# Patient Record
Sex: Female | Born: 1950 | Race: White | Hispanic: No | Marital: Single | Smoking: Never smoker
Health system: Southern US, Community
[De-identification: ages and names within clinical notes are randomized; demographics above are authoritative.]

## PROBLEM LIST (undated history)

## (undated) DIAGNOSIS — I10 Essential (primary) hypertension: Secondary | ICD-10-CM

## (undated) DIAGNOSIS — M7660 Achilles tendinitis, unspecified leg: Secondary | ICD-10-CM

## (undated) HISTORY — DX: Achilles tendinitis, unspecified leg: M76.60

## (undated) HISTORY — DX: Essential (primary) hypertension: I10

---

## 2000-03-29 HISTORY — PX: ANAL FISTULECTOMY: SHX1139

## 2003-09-07 LAB — HM COLONOSCOPY: HM Colonoscopy: NORMAL

## 2004-03-02 ENCOUNTER — Ambulatory Visit: Payer: Self-pay

## 2004-04-06 ENCOUNTER — Ambulatory Visit: Payer: Self-pay | Admitting: Family Medicine

## 2005-04-12 ENCOUNTER — Ambulatory Visit: Payer: Self-pay | Admitting: Unknown Physician Specialty

## 2006-08-11 ENCOUNTER — Ambulatory Visit: Payer: Self-pay | Admitting: Unknown Physician Specialty

## 2007-01-09 ENCOUNTER — Ambulatory Visit: Payer: Self-pay | Admitting: Unknown Physician Specialty

## 2007-09-19 ENCOUNTER — Ambulatory Visit: Payer: Self-pay | Admitting: Unknown Physician Specialty

## 2008-10-30 ENCOUNTER — Ambulatory Visit: Payer: Self-pay | Admitting: Unknown Physician Specialty

## 2009-06-21 IMAGING — US ULTRASOUND RIGHT BREAST
1 series · 13 of 13 positions shown · non-contrast
Comparison: none

REASON FOR EXAM: tenderness and pain Right breast at [DATE] to 11:00 oclock
COMMENTS:

PROCEDURE:     US  - US BREAST RIGHT  - January 09, 2007 [DATE]
RESULT:       No cystic or solid abnormalities are identified.  No focal
abnormalities are identified.
Negative ultrasound does not preclude biopsy if a palpable lesion is
present.

[Series 1: ultrasound right breast · 13 of 13 slices shown]
[im 1/13]
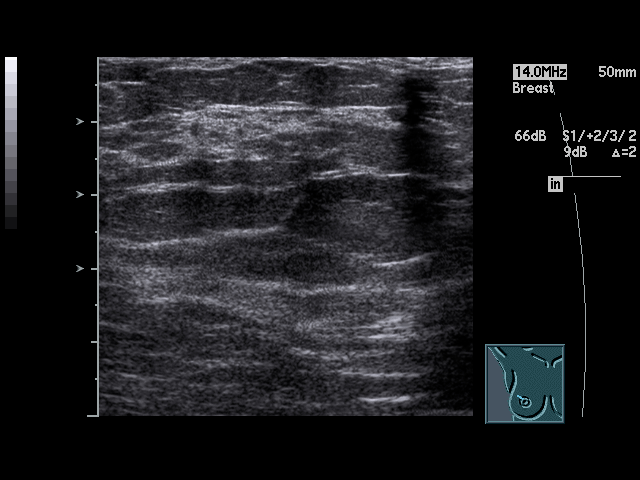
[im 2/13]
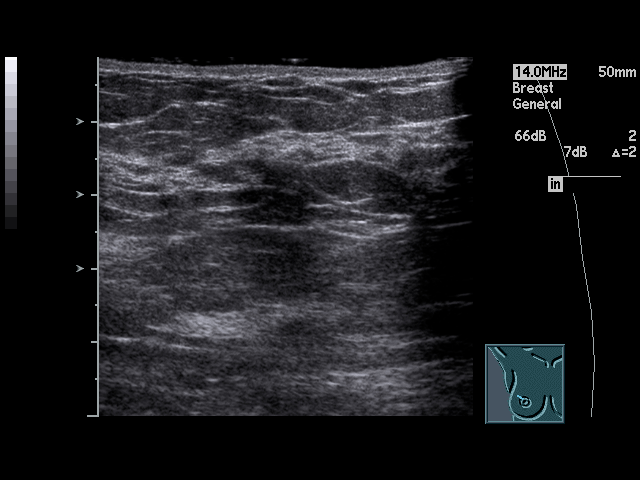
[im 3/13]
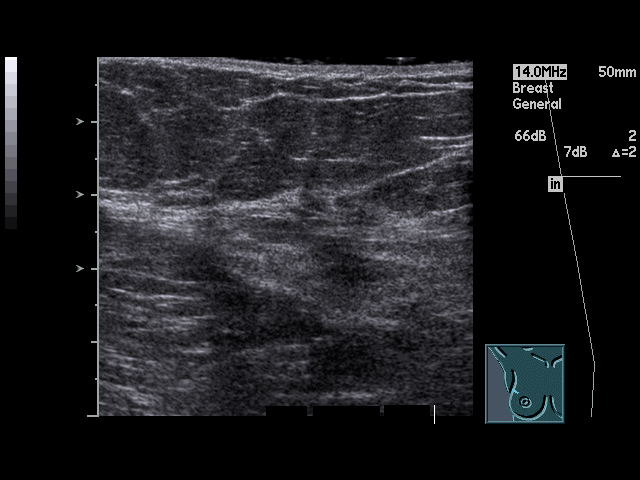
[im 4/13]
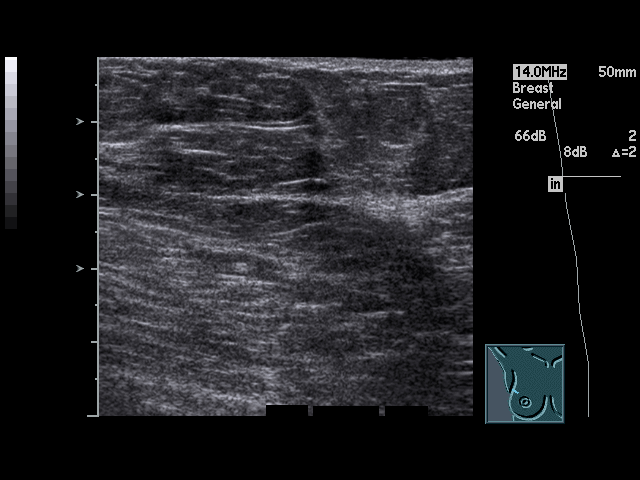
[im 5/13]
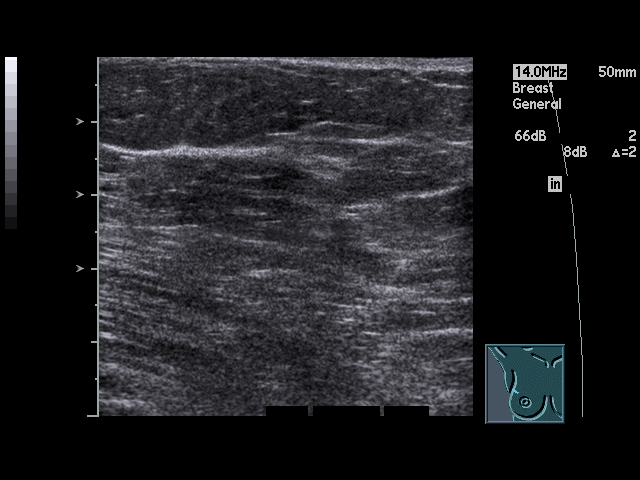
[im 6/13]
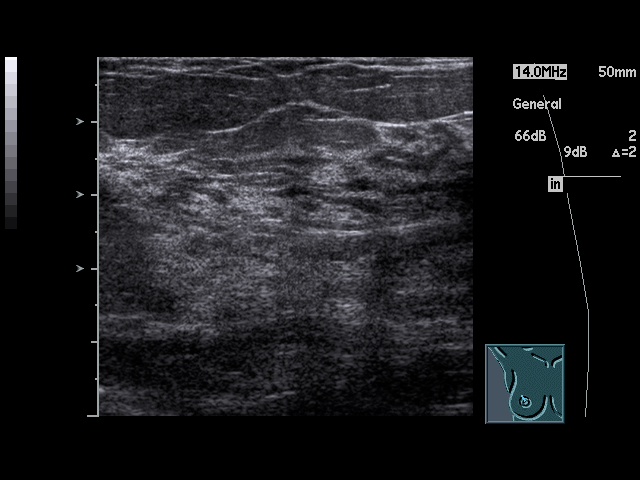
[im 7/13]
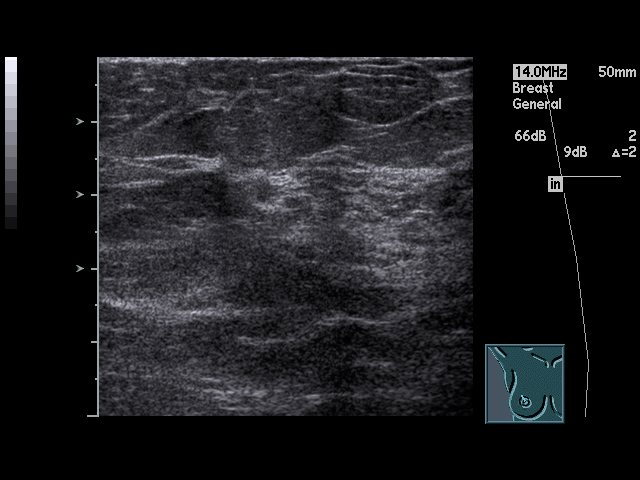
[im 8/13]
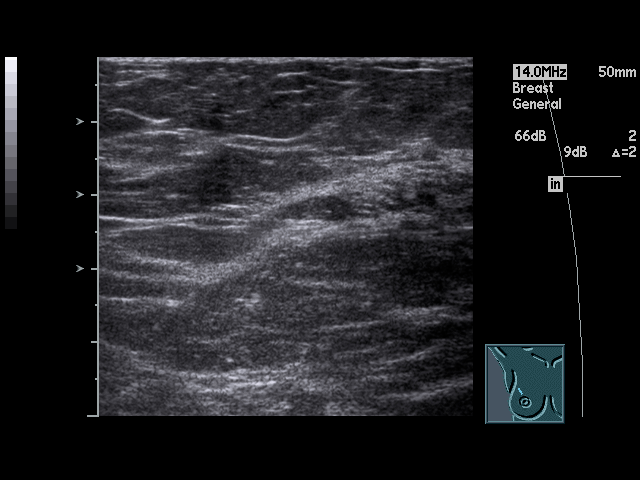
[im 9/13]
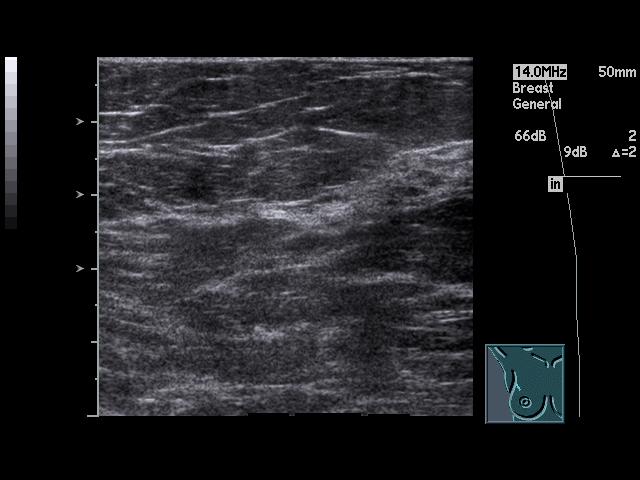
[im 10/13]
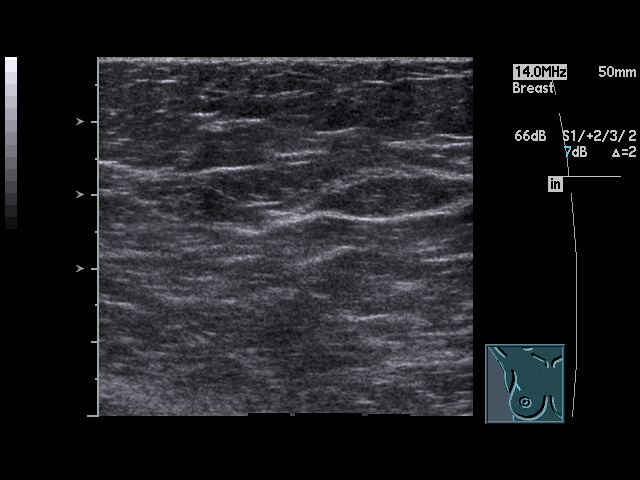
[im 11/13]
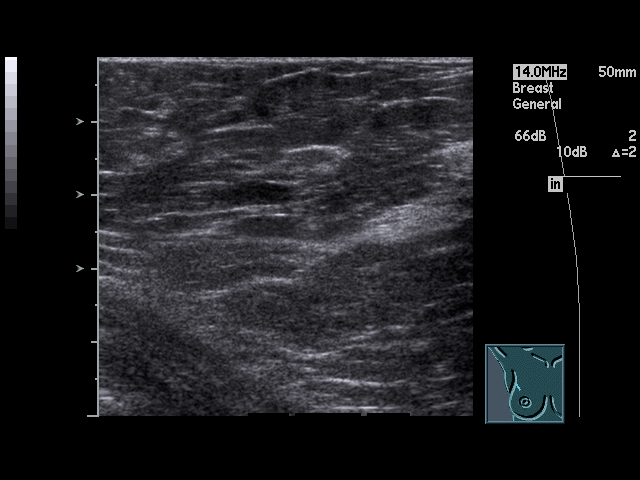
[im 12/13]
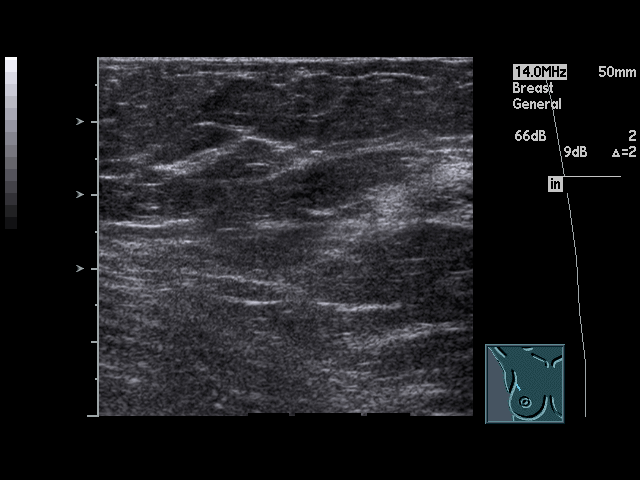
[im 13/13]
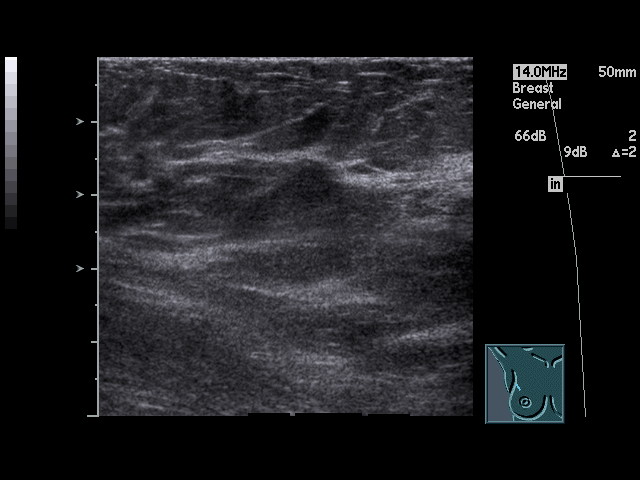

[13 of 13 positions shown; findings below may reference images not displayed]

IMPRESSION: Negative RIGHT breast ultrasound.  See discussion above.

## 2010-06-04 ENCOUNTER — Ambulatory Visit: Payer: Self-pay | Admitting: Unknown Physician Specialty

## 2011-08-05 ENCOUNTER — Ambulatory Visit: Payer: Self-pay | Admitting: Unknown Physician Specialty

## 2011-08-05 LAB — HM MAMMOGRAPHY: HM Mammogram: NORMAL

## 2012-09-06 ENCOUNTER — Encounter: Payer: Self-pay | Admitting: Internal Medicine

## 2012-09-06 ENCOUNTER — Ambulatory Visit (INDEPENDENT_AMBULATORY_CARE_PROVIDER_SITE_OTHER): Payer: No Typology Code available for payment source | Admitting: Internal Medicine

## 2012-09-06 VITALS — BP 104/78 | HR 86 | Temp 98.4°F | Ht 62.75 in | Wt 187.0 lb

## 2012-09-06 DIAGNOSIS — M7662 Achilles tendinitis, left leg: Secondary | ICD-10-CM

## 2012-09-06 DIAGNOSIS — I10 Essential (primary) hypertension: Secondary | ICD-10-CM

## 2012-09-06 DIAGNOSIS — M7660 Achilles tendinitis, unspecified leg: Secondary | ICD-10-CM | POA: Insufficient documentation

## 2012-09-06 DIAGNOSIS — Z1239 Encounter for other screening for malignant neoplasm of breast: Secondary | ICD-10-CM

## 2012-09-06 MED ORDER — LISINOPRIL 20 MG PO TABS: 20.0000 mg | ORAL_TABLET | Freq: Every day | ORAL | Status: DC

## 2012-09-06 NOTE — Assessment & Plan Note (Signed)
Discussed potentially be starting anti-inflammatory medication. Patient prefers to just follow for now. Will request notes from previous physician and podiatrist.

## 2012-09-06 NOTE — Assessment & Plan Note (Signed)
Mammogram ordered

## 2012-09-06 NOTE — Progress Notes (Signed)
Subjective:    Patient ID: Kristie Bell, female    DOB: [birth year], 62 y.o.   MRN: [id number]  HPI 62 year old female with history of hypertension presents to establish care. She reports she is generally feeling well. She has not been seen by a primary care physician in over 2 years. She is compliant with her medication. She has received blood work through her employer, last in October 2013. She denies chest pain, headache, and palpitations.  She also notes a history of chronic Achilles tendinitis in her left foot. She has previously been followed by podiatry. She has used anti-inflammatory medications. She had some improvement with this. She limits her time on her feet and typically wears a shoe with a heel to help with symptoms.  Outpatient Encounter Prescriptions as of 09/06/2012  Medication Sig Dispense Refill  . calcium-vitamin D 250-100 MG-UNIT per tablet Take 1 tablet by mouth 2 (two) times daily.      Marland Kitchen lisinopril (PRINIVIL,ZESTRIL) 20 MG tablet Take 1 tablet (20 mg total) by mouth daily.  90 tablet  4  . Prenatal Vit-Fe Fumarate-FA (M-VIT) tablet Take 1 tablet by mouth daily.      . [DISCONTINUED] lisinopril (PRINIVIL,ZESTRIL) 20 MG tablet Take 20 mg by mouth daily.       No facility-administered encounter medications on file as of 09/06/2012.    Review of Systems  Constitutional: Negative for fever, chills, appetite change, fatigue and unexpected weight change.  HENT: Negative for ear pain, congestion, sore throat, trouble swallowing, neck pain, voice change and sinus pressure.   Eyes: Negative for visual disturbance.  Respiratory: Negative for cough, shortness of breath, wheezing and stridor.   Cardiovascular: Negative for chest pain, palpitations and leg swelling.  Gastrointestinal: Negative for nausea, vomiting, abdominal pain, diarrhea, constipation, blood in stool, abdominal distention and anal bleeding.  Genitourinary: Negative for dysuria and flank pain.  Musculoskeletal:  Negative for myalgias, arthralgias and gait problem.  Skin: Negative for color change and rash.  Neurological: Negative for dizziness and headaches.  Hematological: Negative for adenopathy. Does not bruise/bleed easily.  Psychiatric/Behavioral: Negative for suicidal ideas, sleep disturbance and dysphoric mood. The patient is not nervous/anxious.        Objective:   Physical Exam  Constitutional: She is oriented to person, place, and time. She appears well-developed and well-nourished. No distress.  HENT:  Head: Normocephalic and atraumatic.  Right Ear: External ear normal.  Left Ear: External ear normal.  Nose: Nose normal.  Mouth/Throat: Oropharynx is clear and moist. No oropharyngeal exudate.  Eyes: Conjunctivae are normal. Pupils are equal, round, and reactive to light. Right eye exhibits no discharge. Left eye exhibits no discharge. No scleral icterus.  Neck: Normal range of motion. Neck supple. No tracheal deviation present. No thyromegaly present.  Cardiovascular: Normal rate, regular rhythm, normal heart sounds and intact distal pulses.  Exam reveals no gallop and no friction rub.   No murmur heard. Pulmonary/Chest: Effort normal and breath sounds normal. No accessory muscle usage. Not tachypneic. No respiratory distress. She has no decreased breath sounds. She has no wheezes. She has no rhonchi. She has no rales. She exhibits no tenderness.  Abdominal: Soft. Bowel sounds are normal. She exhibits no distension. There is no tenderness. There is no rebound and no guarding.  Musculoskeletal: Normal range of motion. She exhibits no edema and no tenderness.  Lymphadenopathy:    She has no cervical adenopathy.  Neurological: She is alert and oriented to person, place, and time. No cranial nerve deficit.  She exhibits normal muscle tone. Coordination normal.  Skin: Skin is warm and dry. No rash noted. She is not diaphoretic. No erythema. No pallor.  Psychiatric: She has a normal mood and  affect. Her behavior is normal. Judgment and thought content normal.          Assessment & Plan:

## 2012-09-06 NOTE — Assessment & Plan Note (Signed)
BP Readings from Last 3 Encounters:  09/06/12 104/78   Blood pressure well-controlled on lisinopril. Will continue. Will check renal function with labs.

## 2012-09-14 ENCOUNTER — Encounter: Payer: Self-pay | Admitting: Emergency Medicine

## 2012-09-19 ENCOUNTER — Ambulatory Visit: Payer: Self-pay | Admitting: Internal Medicine

## 2012-09-25 ENCOUNTER — Ambulatory Visit (INDEPENDENT_AMBULATORY_CARE_PROVIDER_SITE_OTHER): Payer: No Typology Code available for payment source | Admitting: Internal Medicine

## 2012-09-25 ENCOUNTER — Other Ambulatory Visit (HOSPITAL_COMMUNITY)
Admission: RE | Admit: 2012-09-25 | Discharge: 2012-09-25 | Disposition: A | Discharge status: Home / Self Care | Payer: No Typology Code available for payment source | Source: Ambulatory Visit | Attending: Internal Medicine | Admitting: Internal Medicine

## 2012-09-25 ENCOUNTER — Encounter: Payer: Self-pay | Admitting: Internal Medicine

## 2012-09-25 VITALS — BP 110/80 | HR 74 | Temp 97.9°F | Ht 63.0 in | Wt 188.0 lb

## 2012-09-25 DIAGNOSIS — Z01419 Encounter for gynecological examination (general) (routine) without abnormal findings: Secondary | ICD-10-CM | POA: Insufficient documentation

## 2012-09-25 DIAGNOSIS — Z0000 Encounter for general adult medical examination without abnormal findings: Secondary | ICD-10-CM | POA: Insufficient documentation

## 2012-09-25 DIAGNOSIS — Z23 Encounter for immunization: Secondary | ICD-10-CM

## 2012-09-25 DIAGNOSIS — I10 Essential (primary) hypertension: Secondary | ICD-10-CM

## 2012-09-25 DIAGNOSIS — Z1151 Encounter for screening for human papillomavirus (HPV): Secondary | ICD-10-CM | POA: Insufficient documentation

## 2012-09-25 DIAGNOSIS — E669 Obesity, unspecified: Secondary | ICD-10-CM

## 2012-09-25 NOTE — Assessment & Plan Note (Signed)
BP Readings from Last 3 Encounters:  09/25/12 110/80  09/06/12 104/78   Blood pressure well-controlled on lisinopril. Will continue. Will request records on recent renal function drawn by her employer.

## 2012-09-25 NOTE — Assessment & Plan Note (Signed)
General medical exam including breast and pelvic exam normal today. Pap is pending. Mammogram results requested. Colonoscopy UTD. Patient recently had blood work performed by her employer. Will request records on this. Prescription for Zostavax vaccine given today.

## 2012-09-25 NOTE — Progress Notes (Signed)
Subjective:    Patient ID: Kristie Bell, female    DOB: April 06, 1950, 62 y.o.   MRN: [id number]  HPI 62 year old female with history of hypertension presents for annual exam. She reports she is generally feeling well. She did not bring record of blood pressure today. She is compliant with medication. She denies any chest pain, headache, palpitations. She is concerned about the right side of her abdomen may be more full the left side. She denies any abdominal or chest pain. She denies any change in bowel habits.  She has been trying to follow a "gluten-free "diet and be more physically active.  Outpatient Encounter Prescriptions as of 09/25/2012  Medication Sig Dispense Refill  . calcium-vitamin D 250-100 MG-UNIT per tablet Take 1 tablet by mouth 2 (two) times daily.      Marland Kitchen lisinopril (PRINIVIL,ZESTRIL) 20 MG tablet Take 1 tablet (20 mg total) by mouth daily.  90 tablet  4  . Multiple Vitamins-Minerals (CENTRUM SILVER PO) Take by mouth.       No facility-administered encounter medications on file as of 09/25/2012.   BP 110/80  Pulse 74  Temp(Src) 97.9 F (36.6 C) (Oral)  Ht 5\' 3"  (1.6 m)  Wt 188 lb (85.276 kg)  BMI 33.31 kg/m2  SpO2 97%  Review of Systems  Constitutional: Negative for fever, chills, appetite change, fatigue and unexpected weight change.  HENT: Negative for ear pain, congestion, sore throat, trouble swallowing, neck pain, voice change and sinus pressure.   Eyes: Negative for visual disturbance.  Respiratory: Negative for cough, shortness of breath, wheezing and stridor.   Cardiovascular: Negative for chest pain, palpitations and leg swelling.  Gastrointestinal: Negative for nausea, vomiting, abdominal pain, diarrhea, constipation, blood in stool, abdominal distention and anal bleeding.  Genitourinary: Negative for dysuria and flank pain.  Musculoskeletal: Negative for myalgias, arthralgias and gait problem.  Skin: Negative for color change and rash.  Neurological:  Negative for dizziness and headaches.  Hematological: Negative for adenopathy. Does not bruise/bleed easily.  Psychiatric/Behavioral: Negative for suicidal ideas, sleep disturbance and dysphoric mood. The patient is not nervous/anxious.        Objective:   Physical Exam  Constitutional: She is oriented to person, place, and time. She appears well-developed and well-nourished. No distress.  HENT:  Head: Normocephalic and atraumatic.  Right Ear: External ear normal.  Left Ear: External ear normal.  Nose: Nose normal.  Mouth/Throat: Oropharynx is clear and moist. No oropharyngeal exudate.  Eyes: Conjunctivae are normal. Pupils are equal, round, and reactive to light. Right eye exhibits no discharge. Left eye exhibits no discharge. No scleral icterus.  Neck: Normal range of motion. Neck supple. No tracheal deviation present. No thyromegaly present.  Cardiovascular: Normal rate, regular rhythm, normal heart sounds and intact distal pulses.  Exam reveals no gallop and no friction rub.   No murmur heard. Pulmonary/Chest: Effort normal and breath sounds normal. No respiratory distress. She has no wheezes. She has no rales. She exhibits no tenderness.  Abdominal: Soft. Bowel sounds are normal. She exhibits no distension and no mass. There is no tenderness. There is no rebound and no guarding.    Genitourinary: Rectum normal, vagina normal and uterus normal. No breast swelling, tenderness, discharge or bleeding. Pelvic exam was performed with patient supine. There is no rash, tenderness or lesion on the right labia. There is no rash, tenderness or lesion on the left labia. Uterus is not enlarged and not tender. Cervix exhibits no motion tenderness, no discharge and no friability. Right  adnexum displays no mass, no tenderness and no fullness. Left adnexum displays no mass, no tenderness and no fullness. No erythema or tenderness around the vagina. No vaginal discharge found.  Musculoskeletal: Normal  range of motion. She exhibits no edema and no tenderness.  Lymphadenopathy:    She has no cervical adenopathy.  Neurological: She is alert and oriented to person, place, and time. No cranial nerve deficit. She exhibits normal muscle tone. Coordination normal.  Skin: Skin is warm and dry. No rash noted. She is not diaphoretic. No erythema. No pallor.  Psychiatric: She has a normal mood and affect. Her behavior is normal. Judgment and thought content normal.          Assessment & Plan:

## 2012-09-25 NOTE — Assessment & Plan Note (Signed)
Wt Readings from Last 3 Encounters:  09/25/12 188 lb (85.276 kg)  09/06/12 187 lb (84.823 kg)   Body mass index is 33.31 kg/(m^2).  Encouraged healthy diet, low in processed carbohydrates and saturated fat. Encouraged goal of per week of exercise.

## 2012-10-03 ENCOUNTER — Encounter: Payer: Self-pay | Admitting: *Deleted

## 2012-10-11 ENCOUNTER — Encounter: Payer: Self-pay | Admitting: Internal Medicine

## 2013-09-25 LAB — HM PAP SMEAR: HM PAP: NEGATIVE

## 2013-11-19 ENCOUNTER — Other Ambulatory Visit: Payer: Self-pay | Admitting: Internal Medicine

## 2013-11-27 ENCOUNTER — Telehealth: Payer: Self-pay | Admitting: Internal Medicine

## 2013-11-27 NOTE — Telephone Encounter (Signed)
Mail lab order to where?

## 2013-11-27 NOTE — Telephone Encounter (Signed)
Sorry   To ms Epstein

## 2013-11-27 NOTE — Telephone Encounter (Signed)
Pt has cpx appointment 10/21 and want like to get labs done prior she can get these done at work  Can you please mail order to \

## 2013-11-30 NOTE — Telephone Encounter (Signed)
Mailed lab order to pt. 

## 2014-01-03 ENCOUNTER — Encounter: Payer: No Typology Code available for payment source | Admitting: Internal Medicine

## 2014-01-09 LAB — BASIC METABOLIC PANEL
BUN: 20 mg/dL (ref 4–21)
Creatinine: 0.8 mg/dL (ref 0.5–1.1)
Glucose: 116 mg/dL
Potassium: 4.6 mmol/L (ref 3.4–5.3)
Sodium: 139 mmol/L (ref 137–147)

## 2014-01-09 LAB — LIPID PANEL
Cholesterol: 208 mg/dL — AB (ref 0–200)
HDL: 37 mg/dL (ref 35–70)
LDL CALC: 111 mg/dL
TRIGLYCERIDES: 302 mg/dL — AB (ref 40–160)

## 2014-01-09 LAB — HEPATIC FUNCTION PANEL
ALK PHOS: 114 U/L (ref 25–125)
ALT: 18 U/L (ref 7–35)
AST: 23 U/L (ref 13–35)
BILIRUBIN, TOTAL: 0.2 mg/dL

## 2014-01-09 LAB — CBC AND DIFFERENTIAL
HCT: 42 % (ref 36–46)
Hemoglobin: 14 g/dL (ref 12.0–16.0)
NEUTROS ABS: 8 /uL
PLATELETS: 247 10*3/uL (ref 150–399)
WBC: 12 10*3/mL

## 2014-01-09 LAB — TSH: TSH: 3.26 u[IU]/mL (ref 0.41–5.90)

## 2014-01-09 LAB — HEMOGLOBIN A1C: Hgb A1c MFr Bld: 5.9 % (ref 4.0–6.0)

## 2014-01-16 ENCOUNTER — Ambulatory Visit: Payer: Self-pay | Admitting: Internal Medicine

## 2014-01-16 ENCOUNTER — Ambulatory Visit (INDEPENDENT_AMBULATORY_CARE_PROVIDER_SITE_OTHER): Payer: No Typology Code available for payment source | Admitting: Internal Medicine

## 2014-01-16 ENCOUNTER — Encounter: Payer: Self-pay | Admitting: *Deleted

## 2014-01-16 ENCOUNTER — Encounter: Payer: Self-pay | Admitting: Internal Medicine

## 2014-01-16 VITALS — BP 112/68 | HR 74 | Temp 97.8°F | Ht 62.9 in | Wt 190.5 lb

## 2014-01-16 DIAGNOSIS — Z0000 Encounter for general adult medical examination without abnormal findings: Secondary | ICD-10-CM

## 2014-01-16 DIAGNOSIS — I10 Essential (primary) hypertension: Secondary | ICD-10-CM

## 2014-01-16 DIAGNOSIS — Z1211 Encounter for screening for malignant neoplasm of colon: Secondary | ICD-10-CM

## 2014-01-16 DIAGNOSIS — E781 Pure hyperglyceridemia: Secondary | ICD-10-CM | POA: Insufficient documentation

## 2014-01-16 DIAGNOSIS — E669 Obesity, unspecified: Secondary | ICD-10-CM | POA: Insufficient documentation

## 2014-01-16 MED ORDER — FLUTICASONE PROPIONATE 50 MCG/ACT NA SUSP: 1.0000 | Freq: Every day | NASAL | Status: DC

## 2014-01-16 MED ORDER — LISINOPRIL 20 MG PO TABS: 20.0000 mg | ORAL_TABLET | Freq: Every day | ORAL | Status: DC

## 2014-01-16 NOTE — Assessment & Plan Note (Signed)
Wt Readings from Last 3 Encounters:  01/16/14 190 lb 8 oz (86.41 kg)  09/25/12 188 lb (85.276 kg)  09/06/12 187 lb (84.823 kg)   Body mass index is 33.84 kg/(m^2). Encouraged healthy diet and exercise with goal of weight loss.

## 2014-01-16 NOTE — Patient Instructions (Signed)

## 2014-01-16 NOTE — Assessment & Plan Note (Signed)
Triglycerides noted to be elevated >300 on recent labs. Encouraged effort at low-sugar diet and regular exercise with goal of weight loss. Will plan repeat labs in 6 months.

## 2014-01-16 NOTE — Assessment & Plan Note (Signed)
General medical exam including breast exam normal today. Mammogram ordered. PAP and pelvic deferred as PAP normal, HPV neg in 2014. Colonoscopy ordered. Immunizations are UTD. Labs reviewed with pt.

## 2014-01-16 NOTE — Progress Notes (Signed)
Subjective:    Patient ID: Kristie Bell, female    DOB: 1950-10-07, 63 y.o.   MRN: [id number]  HPI 63YO female presents for annual exam.  Recently has had some bilateral breast pain. No nodules noted. She is upset as her mammogram could not be scheduled until she was seen.  Had blood work performed 1 week ago. Not aware of results.  No changes to medical history since her last visit 1.5 years ago.  She is not following any specific diet. She is exercising occasionally by using an elliptical.  Review of Systems  Constitutional: Negative for fever, chills, appetite change, fatigue and unexpected weight change.  Eyes: Negative for visual disturbance.  Respiratory: Negative for shortness of breath.   Cardiovascular: Negative for chest pain and leg swelling.  Gastrointestinal: Negative for nausea, vomiting, abdominal pain, diarrhea and constipation.  Musculoskeletal: Negative for arthralgias and myalgias.  Skin: Negative for color change and rash.  Hematological: Negative for adenopathy. Does not bruise/bleed easily.  Psychiatric/Behavioral: Negative for suicidal ideas, sleep disturbance and dysphoric mood. The patient is not nervous/anxious.        Objective:    BP 112/68  Pulse 74  Temp(Src) 97.8 F (36.6 C) (Oral)  Ht 5' 2.9" (1.598 m)  Wt 190 lb 8 oz (86.41 kg)  BMI 33.84 kg/m2  SpO2 96% Physical Exam  Constitutional: She is oriented to person, place, and time. She appears well-developed and well-nourished. No distress.  HENT:  Head: Normocephalic and atraumatic.  Right Ear: External ear normal.  Left Ear: External ear normal.  Nose: Nose normal.  Mouth/Throat: Oropharynx is clear and moist. No oropharyngeal exudate.  Eyes: Conjunctivae are normal. Pupils are equal, round, and reactive to light. Right eye exhibits no discharge. Left eye exhibits no discharge. No scleral icterus.  Neck: Normal range of motion. Neck supple. No tracheal deviation present. No thyromegaly  present.  Cardiovascular: Normal rate, regular rhythm, normal heart sounds and intact distal pulses.  Exam reveals no gallop and no friction rub.   No murmur heard. Pulmonary/Chest: Effort normal and breath sounds normal. No accessory muscle usage. Not tachypneic. No respiratory distress. She has no decreased breath sounds. She has no wheezes. She has no rhonchi. She has no rales. She exhibits no tenderness. Right breast exhibits no inverted nipple, no mass, no nipple discharge, no skin change and no tenderness. Left breast exhibits no inverted nipple, no mass, no nipple discharge, no skin change and no tenderness. Breasts are symmetrical.  Abdominal: Soft. Bowel sounds are normal. She exhibits no distension and no mass. There is no tenderness. There is no rebound and no guarding.  Musculoskeletal: Normal range of motion. She exhibits no edema and no tenderness.  Lymphadenopathy:    She has no cervical adenopathy.  Neurological: She is alert and oriented to person, place, and time. No cranial nerve deficit. She exhibits normal muscle tone. Coordination normal.  Skin: Skin is warm and dry. No rash noted. She is not diaphoretic. No erythema. No pallor.  Psychiatric: She has a normal mood and affect. Her behavior is normal. Judgment and thought content normal.          Assessment & Plan:   Problem List Items Addressed This Visit     Unprioritized   Essential hypertension, benign      BP Readings from Last 3 Encounters:  01/16/14 112/68  09/25/12 110/80  09/06/12 104/78   BP well controlled on lisinopril. Renal function normal on recent labs.    Relevant  Medications      lisinopril (PRINIVIL,ZESTRIL) tablet   Hypertriglyceridemia     Triglycerides noted to be elevated >300 on recent labs. Encouraged effort at low-sugar diet and regular exercise with goal of weight loss. Will plan repeat labs in 6 months.    Relevant Medications      lisinopril (PRINIVIL,ZESTRIL) tablet   Obesity  (BMI 30-39.9)      Wt Readings from Last 3 Encounters:  01/16/14 190 lb 8 oz (86.41 kg)  09/25/12 188 lb (85.276 kg)  09/06/12 187 lb (84.823 kg)   Body mass index is 33.84 kg/(m^2). Encouraged healthy diet and exercise with goal of weight loss.    Routine general medical examination at a health care facility - Primary     General medical exam including breast exam normal today. Mammogram ordered. PAP and pelvic deferred as PAP normal, HPV neg in 2014. Colonoscopy ordered. Immunizations are UTD. Labs reviewed with pt.    Relevant Orders      MM Digital Screening   Screening for colon cancer     Referral placed for colonoscopy.    Relevant Orders      Ambulatory referral to Gastroenterology       Return in about 6 months (around 07/18/2014) for Recheck.

## 2014-01-16 NOTE — Assessment & Plan Note (Signed)
Referral placed for colonoscopy. 

## 2014-01-16 NOTE — Assessment & Plan Note (Signed)
BP Readings from Last 3 Encounters:  01/16/14 112/68  09/25/12 110/80  09/06/12 104/78   BP well controlled on lisinopril. Renal function normal on recent labs.

## 2014-01-16 NOTE — Progress Notes (Signed)
Pre visit review using our clinic review tool, if applicable. No additional management support is needed unless otherwise documented below in the visit note. 

## 2014-01-17 ENCOUNTER — Telehealth: Payer: Self-pay | Admitting: Internal Medicine

## 2014-01-17 NOTE — Telephone Encounter (Signed)
Mammogram from 10/21 showed "possible distortion" in the right breast. They have recommended additional views on mammogram and also ultrasound. Has this been scheduled?

## 2014-01-17 NOTE — Telephone Encounter (Signed)
emmi mailed  °

## 2014-01-21 NOTE — Telephone Encounter (Signed)
Left vm for pt to return my call.  

## 2014-01-22 NOTE — Telephone Encounter (Signed)
Pt states that she received a call from the hospital but didn't get the call back number, gave her the number to contact them to schedule the appt

## 2014-02-05 ENCOUNTER — Ambulatory Visit: Payer: Self-pay | Admitting: Internal Medicine

## 2014-02-05 ENCOUNTER — Telehealth: Payer: Self-pay | Admitting: Internal Medicine

## 2014-02-05 DIAGNOSIS — R928 Other abnormal and inconclusive findings on diagnostic imaging of breast: Secondary | ICD-10-CM

## 2014-02-05 NOTE — Telephone Encounter (Signed)
Recent mammogram from 11/10 showed persistent area of distortion in the right breast. They recommended bilateral breast MRI.Has this been scheduled?

## 2014-02-07 ENCOUNTER — Other Ambulatory Visit: Payer: Self-pay | Admitting: Internal Medicine

## 2014-02-07 NOTE — Telephone Encounter (Signed)
Order placed for MRI

## 2014-02-07 NOTE — Telephone Encounter (Signed)
Pt states [facility] contacted her and told her MRI was recommended, but states her PCP would schedule this for her. Advised our [facility] would contact her with an appointment

## 2014-02-17 ENCOUNTER — Ambulatory Visit
Admission: RE | Admit: 2014-02-17 | Discharge: 2014-02-17 | Disposition: A | Discharge status: Home / Self Care | Payer: No Typology Code available for payment source | Source: Ambulatory Visit | Attending: Internal Medicine | Admitting: Internal Medicine

## 2014-02-17 ENCOUNTER — Other Ambulatory Visit: Payer: No Typology Code available for payment source

## 2014-02-17 DIAGNOSIS — R928 Other abnormal and inconclusive findings on diagnostic imaging of breast: Secondary | ICD-10-CM

## 2014-02-17 MED ORDER — GADOBENATE DIMEGLUMINE 529 MG/ML IV SOLN
17.0000 mL | Freq: Once | INTRAVENOUS | Status: AC | PRN
  Administered 2014-02-17: 17 mL via INTRAVENOUS

## 2014-02-18 ENCOUNTER — Encounter: Payer: Self-pay | Admitting: Internal Medicine

## 2014-02-20 ENCOUNTER — Telehealth: Payer: Self-pay

## 2014-02-20 NOTE — Telephone Encounter (Signed)
Notified pt that we have not received any results as of now

## 2014-02-20 NOTE — Telephone Encounter (Signed)
The patient called asking for a call regarding her MRI results.   Callback [phone]

## 2014-02-26 ENCOUNTER — Encounter: Payer: Self-pay | Admitting: Internal Medicine

## 2014-03-14 ENCOUNTER — Telehealth: Payer: Self-pay | Admitting: Internal Medicine

## 2014-03-14 NOTE — Telephone Encounter (Signed)
Opened in error

## 2014-04-22 ENCOUNTER — Ambulatory Visit: Payer: Self-pay | Admitting: Gastroenterology

## 2014-06-03 ENCOUNTER — Encounter: Payer: Self-pay | Admitting: Internal Medicine

## 2014-07-01 ENCOUNTER — Telehealth: Payer: Self-pay | Admitting: *Deleted

## 2014-07-01 NOTE — Telephone Encounter (Signed)
Pt called left message requesting whether Dr Gilford Rile needs additional labs.  Last OV 10.21.15, did not see orders for labs however there was a note in OV for pt to return for f/u in 6 months.  I left message for pt to return call for clarification.

## 2014-08-07 ENCOUNTER — Telehealth: Payer: Self-pay | Admitting: *Deleted

## 2014-08-08 NOTE — Telephone Encounter (Signed)
Please see below note

## 2014-09-11 LAB — HEPATIC FUNCTION PANEL
ALT: 21 U/L (ref 7–35)
AST: 20 U/L (ref 13–35)
Alkaline Phosphatase: 93 U/L (ref 25–125)
Bilirubin, Total: 0.3 mg/dL

## 2014-09-11 LAB — LIPID PANEL
CHOLESTEROL: 184 mg/dL (ref 0–200)
HDL: 39 mg/dL (ref 35–70)
LDL Cholesterol: 103 mg/dL
Triglycerides: 208 mg/dL — AB (ref 40–160)

## 2014-09-11 LAB — BASIC METABOLIC PANEL
BUN: 15 mg/dL (ref 4–21)
CREATININE: 0.8 mg/dL (ref 0.5–1.1)
Glucose: 125 mg/dL
Potassium: 4.7 mmol/L (ref 3.4–5.3)
Sodium: 142 mmol/L (ref 137–147)

## 2014-09-11 LAB — CBC AND DIFFERENTIAL
HCT: 41 % (ref 36–46)
Hemoglobin: 13.6 g/dL (ref 12.0–16.0)
Neutrophils Absolute: 5 /uL
PLATELETS: 219 10*3/uL (ref 150–399)
WBC: 9.5 10*3/mL

## 2014-09-11 LAB — HEMOGLOBIN A1C: Hgb A1c MFr Bld: 6 % (ref 4.0–6.0)

## 2014-09-11 LAB — TSH: TSH: 2.63 u[IU]/mL (ref 0.41–5.90)

## 2014-09-23 ENCOUNTER — Encounter: Payer: Self-pay | Admitting: Internal Medicine

## 2014-09-23 ENCOUNTER — Encounter: Payer: Self-pay | Admitting: *Deleted

## 2014-09-23 ENCOUNTER — Ambulatory Visit (INDEPENDENT_AMBULATORY_CARE_PROVIDER_SITE_OTHER): Payer: No Typology Code available for payment source | Admitting: Internal Medicine

## 2014-09-23 VITALS — BP 95/61 | HR 74 | Temp 98.1°F | Ht 62.9 in | Wt 191.5 lb

## 2014-09-23 DIAGNOSIS — E781 Pure hyperglyceridemia: Secondary | ICD-10-CM

## 2014-09-23 DIAGNOSIS — Z1239 Encounter for other screening for malignant neoplasm of breast: Secondary | ICD-10-CM

## 2014-09-23 DIAGNOSIS — I10 Essential (primary) hypertension: Secondary | ICD-10-CM | POA: Diagnosis not present

## 2014-09-23 DIAGNOSIS — E8881 Metabolic syndrome: Secondary | ICD-10-CM

## 2014-09-23 NOTE — Progress Notes (Signed)
Pre visit review using our clinic review tool, if applicable. No additional management support is needed unless otherwise documented below in the visit note. 

## 2014-09-23 NOTE — Assessment & Plan Note (Signed)
Reviewed recent MRI breast with pt which showed normal bilateral breast, BIRADS1. Recommended follow up 3d mammogram in 12/2014.

## 2014-09-23 NOTE — Assessment & Plan Note (Signed)
Discussed recent labs which showed elevated BG, low HDL, elevated TG, c/w metabolic syndrome. Encouraged her to set goal for exercise >127min per week and follow a low glycemic diet.

## 2014-09-23 NOTE — Assessment & Plan Note (Signed)
TG elevated on labs with low HDL. Encouraged healthy, Mediterranean style diet and exercise.

## 2014-09-23 NOTE — Assessment & Plan Note (Signed)
BP Readings from Last 3 Encounters:  09/23/14 95/61  01/16/14 112/68  09/25/12 110/80   BP well controlled on current medication. Recent renal function was normal.

## 2014-09-23 NOTE — Progress Notes (Signed)
Subjective:    Patient ID: Kristie Bell, female    DOB: 1950-06-03, 64 y.o.   MRN: [id number]  HPI  64YO female presents for follow up.  HTN - BP has been well controlled at home. Compliant with medications. No CP, palpitations.  Concerned about previous mammogram last year being "abnormal."  Also concerned about elevated BG on recent labs with A1c of 6% and elevated TG 200.  BP Readings from Last 3 Encounters:  09/23/14 95/61  01/16/14 112/68  09/25/12 110/80     Past medical, surgical, family and social history per today's encounter.  Review of Systems  Constitutional: Negative for fever, chills, appetite change, fatigue and unexpected weight change.  Eyes: Negative for visual disturbance.  Respiratory: Negative for shortness of breath.   Cardiovascular: Negative for chest pain and leg swelling.  Gastrointestinal: Negative for abdominal pain.  Skin: Negative for color change and rash.  Hematological: Negative for adenopathy. Does not bruise/bleed easily.  Psychiatric/Behavioral: Negative for dysphoric mood. The patient is not nervous/anxious.        Objective:    BP 95/61 mmHg  Pulse 74  Temp(Src) 98.1 F (36.7 C) (Oral)  Ht 5' 2.9" (1.598 m)  Wt 191 lb 8 oz (86.864 kg)  BMI 34.02 kg/m2  SpO2 97% Physical Exam  Constitutional: She is oriented to person, place, and time. She appears well-developed and well-nourished. No distress.  HENT:  Head: Normocephalic and atraumatic.  Right Ear: External ear normal.  Left Ear: External ear normal.  Nose: Nose normal.  Mouth/Throat: Oropharynx is clear and moist. No oropharyngeal exudate.  Eyes: Conjunctivae are normal. Pupils are equal, round, and reactive to light. Right eye exhibits no discharge. Left eye exhibits no discharge. No scleral icterus.  Neck: Normal range of motion. Neck supple. No tracheal deviation present. No thyromegaly present.  Cardiovascular: Normal rate, regular rhythm, normal heart sounds and  intact distal pulses.  Exam reveals no gallop and no friction rub.   No murmur heard. Pulmonary/Chest: Effort normal and breath sounds normal. No respiratory distress. She has no wheezes. She has no rales. She exhibits no tenderness.  Musculoskeletal: Normal range of motion. She exhibits no edema or tenderness.  Lymphadenopathy:    She has no cervical adenopathy.  Neurological: She is alert and oriented to person, place, and time. No cranial nerve deficit. She exhibits normal muscle tone. Coordination normal.  Skin: Skin is warm and dry. No rash noted. She is not diaphoretic. No erythema. No pallor.  Psychiatric: She has a normal mood and affect. Her behavior is normal. Judgment and thought content normal.          Assessment & Plan:   Problem List Items Addressed This Visit      Unprioritized   Essential hypertension, benign - Primary    BP Readings from Last 3 Encounters:  09/23/14 95/61  01/16/14 112/68  09/25/12 110/80   BP well controlled on current medication. Recent renal function was normal.      Hypertriglyceridemia    TG elevated on labs with low HDL. Encouraged healthy, Mediterranean style diet and exercise.      Metabolic syndrome    Discussed recent labs which showed elevated BG, low HDL, elevated TG, c/w metabolic syndrome. Encouraged her to set goal for exercise >159min per week and follow a low glycemic diet.      Screening for breast cancer    Reviewed recent MRI breast with pt which showed normal bilateral breast, BIRADS1. Recommended follow up  3d mammogram in 12/2014.          Return in about 6 months (around 03/25/2015) for Physical.

## 2014-09-23 NOTE — Patient Instructions (Addendum)
Follow up in 6 months.  Consider reading the book Always Hungry by Dr. Isabella Stalling.  Protein bars - [employer]

## 2014-12-01 ENCOUNTER — Other Ambulatory Visit: Payer: Self-pay | Admitting: Internal Medicine

## 2015-02-05 ENCOUNTER — Telehealth: Payer: Self-pay | Admitting: Internal Medicine

## 2015-02-05 NOTE — Telephone Encounter (Signed)
Pt called about needing the form sent via mail to her to get her lab work done. Need orders. Pt needs it before her lab appt on 02/17/2015.Thank You!

## 2015-02-06 NOTE — Telephone Encounter (Signed)
[facility] form ready for your signature/lab selection in folder

## 2015-02-07 NOTE — Telephone Encounter (Signed)
Mailed [facility] lab order form to pt

## 2015-02-10 ENCOUNTER — Telehealth: Payer: Self-pay | Admitting: Internal Medicine

## 2015-02-10 NOTE — Telephone Encounter (Signed)
Did we fill this out already?

## 2015-02-10 NOTE — Telephone Encounter (Signed)
Needs her blood work mailed to her before her CPE.

## 2015-02-11 ENCOUNTER — Telehealth: Payer: Self-pay | Admitting: *Deleted

## 2015-02-11 NOTE — Telephone Encounter (Signed)
See additional phone note started on 02/05/15

## 2015-02-11 NOTE — Telephone Encounter (Signed)
Patient requested a lab sheet that Dr. Gilford Rile would like drawn. Before her physical. This lab sheet will be needed before Monday.

## 2015-02-11 NOTE — Telephone Encounter (Signed)
Left a message for patient that form was mailed to her on Friday, she should receive it today or tomorrow.

## 2015-02-11 NOTE — Telephone Encounter (Signed)
As per phone note on 02/05/15, [facility] order was mailed to the patient

## 2015-02-17 ENCOUNTER — Other Ambulatory Visit: Payer: Self-pay | Admitting: Physician Assistant

## 2015-02-17 LAB — CBC AND DIFFERENTIAL
HCT: 39 % (ref 36–46)
HEMOGLOBIN: 13.2 g/dL (ref 12.0–16.0)
PLATELETS: 225 10*3/uL (ref 150–399)

## 2015-02-17 LAB — BASIC METABOLIC PANEL
BUN: 12 mg/dL (ref 4–21)
Creatinine: 0.7 mg/dL (ref 0.5–1.1)
GLUCOSE: 101 mg/dL
Potassium: 4.4 mmol/L (ref 3.4–5.3)
Sodium: 141 mmol/L (ref 137–147)

## 2015-02-17 LAB — TSH: TSH: 3.06 u[IU]/mL (ref 0.41–5.90)

## 2015-02-17 LAB — LIPID PANEL
Cholesterol: 190 mg/dL (ref 0–200)
LDL Cholesterol: 114 mg/dL

## 2015-02-17 NOTE — Progress Notes (Signed)
Patient came in to have blood drawn per Dr. Ronette Deter at [facility] in [place].  Blood was drawn from right arm without any incident.

## 2015-02-18 LAB — CMP12+LP+TP+TSH+6AC+CBC/D/PLT
ALT: 26 IU/L (ref 0–32)
AST: 30 IU/L (ref 0–40)
Albumin/Globulin Ratio: 1.6 (ref 1.1–2.5)
Albumin: 4 g/dL (ref 3.6–4.8)
Alkaline Phosphatase: 100 IU/L (ref 39–117)
BILIRUBIN TOTAL: 0.3 mg/dL (ref 0.0–1.2)
BUN / CREAT RATIO: 17 (ref 11–26)
BUN: 12 mg/dL (ref 8–27)
Basophils Absolute: 0 10*3/uL (ref 0.0–0.2)
Basos: 0 %
CREATININE: 0.71 mg/dL (ref 0.57–1.00)
Calcium: 8.9 mg/dL (ref 8.7–10.3)
Chloride: 102 mmol/L (ref 97–106)
Chol/HDL Ratio: 4.9 ratio units — ABNORMAL HIGH (ref 0.0–4.4)
Cholesterol, Total: 190 mg/dL (ref 100–199)
EOS (ABSOLUTE): 0.5 10*3/uL — ABNORMAL HIGH (ref 0.0–0.4)
EOS: 5 %
Estimated CHD Risk: 1.3 times avg. — ABNORMAL HIGH (ref 0.0–1.0)
Free Thyroxine Index: 2.5 (ref 1.2–4.9)
GFR calc Af Amer: 103 mL/min/{1.73_m2} (ref >59)
GFR, EST NON AFRICAN AMERICAN: 90 mL/min/{1.73_m2} (ref >59)
GGT: 21 IU/L (ref 0–60)
GLUCOSE: 101 mg/dL — AB (ref 65–99)
Globulin, Total: 2.5 g/dL (ref 1.5–4.5)
HDL: 39 mg/dL — ABNORMAL LOW (ref >39)
HEMOGLOBIN: 13.2 g/dL (ref 11.1–15.9)
Hematocrit: 38.9 % (ref 34.0–46.6)
Immature Grans (Abs): 0 10*3/uL (ref 0.0–0.1)
Immature Granulocytes: 0 %
Iron: 98 ug/dL (ref 27–139)
LDH: 189 IU/L (ref 119–226)
LDL Calculated: 114 mg/dL — ABNORMAL HIGH (ref 0–99)
Lymphocytes Absolute: 2.5 10*3/uL (ref 0.7–3.1)
Lymphs: 25 %
MCH: 29.5 pg (ref 26.6–33.0)
MCHC: 33.9 g/dL (ref 31.5–35.7)
MCV: 87 fL (ref 79–97)
MONOCYTES: 6 %
Monocytes Absolute: 0.6 10*3/uL (ref 0.1–0.9)
NEUTROS ABS: 6.3 10*3/uL (ref 1.4–7.0)
Neutrophils: 64 %
POTASSIUM: 4.4 mmol/L (ref 3.5–5.2)
Phosphorus: 3.4 mg/dL (ref 2.5–4.5)
Platelets: 225 10*3/uL (ref 150–379)
RBC: 4.48 x10E6/uL (ref 3.77–5.28)
RDW: 13 % (ref 12.3–15.4)
Sodium: 141 mmol/L (ref 136–144)
T3 Uptake Ratio: 27 % (ref 24–39)
T4 TOTAL: 9.2 ug/dL (ref 4.5–12.0)
TSH: 3.06 u[IU]/mL (ref 0.450–4.500)
Total Protein: 6.5 g/dL (ref 6.0–8.5)
Triglycerides: 184 mg/dL — ABNORMAL HIGH (ref 0–149)
URIC ACID: 7.2 mg/dL — AB (ref 2.5–7.1)
VLDL Cholesterol Cal: 37 mg/dL (ref 5–40)
WBC: 10 10*3/uL (ref 3.4–10.8)

## 2015-02-18 LAB — VITAMIN D 25 HYDROXY (VIT D DEFICIENCY, FRACTURES): Vit D, 25-Hydroxy: 36.8 ng/mL (ref 30.0–100.0)

## 2015-02-18 LAB — HGB A1C W/O EAG: Hgb A1c MFr Bld: 6.2 % — ABNORMAL HIGH (ref 4.8–5.6)

## 2015-02-18 NOTE — Progress Notes (Signed)
Lab results were faxed to Dr. Ronette Deter at [facility] and a copy was mailed to the patient per her request.

## 2015-03-04 ENCOUNTER — Ambulatory Visit (INDEPENDENT_AMBULATORY_CARE_PROVIDER_SITE_OTHER): Payer: No Typology Code available for payment source | Admitting: Internal Medicine

## 2015-03-04 ENCOUNTER — Other Ambulatory Visit: Payer: Self-pay | Admitting: *Deleted

## 2015-03-04 ENCOUNTER — Encounter: Payer: Self-pay | Admitting: Internal Medicine

## 2015-03-04 ENCOUNTER — Other Ambulatory Visit (HOSPITAL_COMMUNITY)
Admission: RE | Admit: 2015-03-04 | Discharge: 2015-03-04 | Disposition: A | Discharge status: Home / Self Care | Payer: No Typology Code available for payment source | Source: Ambulatory Visit | Attending: Internal Medicine | Admitting: Internal Medicine

## 2015-03-04 VITALS — BP 115/75 | HR 81 | Temp 98.1°F | Ht 63.0 in | Wt 180.5 lb

## 2015-03-04 DIAGNOSIS — Z1151 Encounter for screening for human papillomavirus (HPV): Secondary | ICD-10-CM | POA: Insufficient documentation

## 2015-03-04 DIAGNOSIS — Z1239 Encounter for other screening for malignant neoplasm of breast: Secondary | ICD-10-CM

## 2015-03-04 DIAGNOSIS — Z0000 Encounter for general adult medical examination without abnormal findings: Secondary | ICD-10-CM

## 2015-03-04 DIAGNOSIS — E8881 Metabolic syndrome: Secondary | ICD-10-CM

## 2015-03-04 DIAGNOSIS — Z0001 Encounter for general adult medical examination with abnormal findings: Secondary | ICD-10-CM

## 2015-03-04 DIAGNOSIS — Z23 Encounter for immunization: Secondary | ICD-10-CM | POA: Diagnosis not present

## 2015-03-04 DIAGNOSIS — R1032 Left lower quadrant pain: Secondary | ICD-10-CM | POA: Diagnosis not present

## 2015-03-04 DIAGNOSIS — Z299 Encounter for prophylactic measures, unspecified: Secondary | ICD-10-CM

## 2015-03-04 DIAGNOSIS — Z01419 Encounter for gynecological examination (general) (routine) without abnormal findings: Secondary | ICD-10-CM | POA: Insufficient documentation

## 2015-03-04 NOTE — Progress Notes (Signed)
Subjective:    Patient ID: Kristie Bell, female    DOB: [birth year], 64 y.o.   MRN: [id number]  HPI  64YO female presents for physical exam.  Left lower abdominal pain - 6-7 months of intermittent left lower abdominal pain. Described as throbbing pain. Not severe. No diarrhea. No constipation. No urinary symptoms. No previous abdominal surgery. No bloating.  Aside from this, feeling well. Concerned about BG, however not following any specific diet or exercise program.  Wt Readings from Last 3 Encounters:  03/04/15 180 lb 8 oz (81.874 kg)  09/23/14 191 lb 8 oz (86.864 kg)  02/17/14 190 lb (86.183 kg)   BP Readings from Last 3 Encounters:  03/04/15 115/75  09/23/14 95/61  01/16/14 112/68    Past Medical History  Diagnosis Date  . Hypertension   . Achilles tendinitis     left, Dr. Elvina Mattes   Family History  Problem Relation Age of Onset  . Pneumonia Father   . Diabetes Paternal Grandmother    Past Surgical History  Procedure Laterality Date  . Anal fistulectomy  2002   Social History   Social History  . Marital Status: Single    Spouse Name: N/A  . Number of Children: N/A  . Years of Education: N/A   Social History Main Topics  . Smoking status: Never Smoker   . Smokeless tobacco: Never Used  . Alcohol Use: Yes     Comment: Occasionally  . Drug Use: No  . Sexual Activity: Not Asked   Other Topics Concern  . None   Social History Narrative   Lives in [place] alone. No children. No pets.      Diet - regular diet, started protein drinks      Exercise - 43min 3-4 times per week      Work - [employer]    Review of Systems  Constitutional: Negative for fever, chills, appetite change, fatigue and unexpected weight change.  Eyes: Negative for visual disturbance.  Respiratory: Negative for shortness of breath.   Cardiovascular: Negative for chest pain and leg swelling.  Gastrointestinal: Positive for abdominal pain. Negative for  nausea, vomiting, diarrhea, constipation, blood in stool and abdominal distention.  Genitourinary: Negative for dysuria, urgency, frequency, flank pain, vaginal bleeding, vaginal discharge, difficulty urinating, vaginal pain and pelvic pain.  Musculoskeletal: Negative for myalgias and arthralgias.  Skin: Negative for color change and rash.  Hematological: Negative for adenopathy. Does not bruise/bleed easily.  Psychiatric/Behavioral: Negative for sleep disturbance and dysphoric mood. The patient is not nervous/anxious.        Objective:    BP 115/75 mmHg  Pulse 81  Temp(Src) 98.1 F (36.7 C) (Oral)  Ht 5\' 3"  (1.6 m)  Wt 180 lb 8 oz (81.874 kg)  BMI 31.98 kg/m2  SpO2 98% Physical Exam  Constitutional: She is oriented to person, place, and time. She appears well-developed and well-nourished. No distress.  HENT:  Head: Normocephalic and atraumatic.  Right Ear: External ear normal.  Left Ear: External ear normal.  Nose: Nose normal.  Mouth/Throat: Oropharynx is clear and moist. No oropharyngeal exudate.  Eyes: Conjunctivae are normal. Pupils are equal, round, and reactive to light. Right eye exhibits no discharge. Left eye exhibits no discharge. No scleral icterus.  Neck: Normal range of motion. Neck supple. No tracheal deviation present. No thyromegaly present.  Cardiovascular: Normal rate, regular rhythm, normal heart sounds and intact distal pulses.  Exam reveals no gallop and no friction rub.   No  murmur heard. Pulmonary/Chest: Effort normal and breath sounds normal. No respiratory distress. She has no wheezes. She has no rales. She exhibits no tenderness.  Abdominal: Soft. Bowel sounds are normal. She exhibits no distension and no mass. There is no tenderness. There is no rebound and no guarding.  Genitourinary: Rectum normal and uterus normal. No breast swelling, tenderness, discharge or bleeding. Pelvic exam was performed with patient supine. There is no rash, tenderness or lesion  on the right labia. There is no rash, tenderness or lesion on the left labia. Uterus is not enlarged and not tender. Cervix exhibits no motion tenderness, no discharge and no friability. Right adnexum displays no mass, no tenderness and no fullness. Left adnexum displays no mass, no tenderness and no fullness. There is erythema (mild) in the vagina. No tenderness in the vagina. No vaginal discharge found.  Musculoskeletal: Normal range of motion. She exhibits no edema or tenderness.  Lymphadenopathy:    She has no cervical adenopathy.  Neurological: She is alert and oriented to person, place, and time. No cranial nerve deficit. She exhibits normal muscle tone. Coordination normal.  Skin: Skin is warm and dry. No rash noted. She is not diaphoretic. No erythema. No pallor.  Psychiatric: She has a normal mood and affect. Her behavior is normal. Judgment and thought content normal.          Assessment & Plan:   Problem List Items Addressed This Visit      Unprioritized   Abdominal pain, left lower quadrant    Left lower abdominal pain. Exam normal except for mild tenderness. No palpable abnormalities. Discussed potential causes of pain. Will get CT abdomen for evaluation.      Relevant Orders   CT Abdomen Pelvis W Contrast   Metabolic syndrome    Discussed elevated BG, obesity and elevated TG with low HDL. Encouraged Mediterranean style diet and exercise with goal of 71min daily.      Routine general medical examination at a health care facility - Primary    General medical exam including breast exam and pelvic exam normal except as noted. PAP pending. Reviewed labs.  Encouraged healthy diet and exercise. Prevnar today.      Screening for breast cancer   Relevant Orders   MM Digital Screening       Return in about 4 weeks (around 04/01/2015) for Recheck.

## 2015-03-04 NOTE — Assessment & Plan Note (Signed)
Discussed elevated BG, obesity and elevated TG with low HDL. Encouraged Mediterranean style diet and exercise with goal of 84min daily.

## 2015-03-04 NOTE — Assessment & Plan Note (Signed)
Left lower abdominal pain. Exam normal except for mild tenderness. No palpable abnormalities. Discussed potential causes of pain. Will get CT abdomen for evaluation.

## 2015-03-04 NOTE — Addendum Note (Signed)
Addended by: Karlene Einstein D on: 03/04/2015 04:23 PM   Modules accepted: Orders

## 2015-03-04 NOTE — Addendum Note (Signed)
Addended by: Carmin Muskrat on: 03/04/2015 09:14 AM   Modules accepted: Orders, SmartSet

## 2015-03-04 NOTE — Patient Instructions (Signed)
We will schedule a CT of your abdomen to better determine the cause of your abdominal pain.  Health Maintenance, Female Adopting a healthy lifestyle and getting preventive care can go a long way to promote health and wellness. Talk with your health care provider about what schedule of regular examinations is right for you. This is a good chance for you to check in with your provider about disease prevention and staying healthy. In between checkups, there are plenty of things you can do on your own. Experts have done a lot of research about which lifestyle changes and preventive measures are most likely to keep you healthy. Ask your health care provider for more information. WEIGHT AND DIET  Eat a healthy diet  Be sure to include plenty of vegetables, fruits, low-fat dairy products, and lean protein.  Do not eat a lot of foods high in solid fats, added sugars, or salt.  Get regular exercise. This is one of the most important things you can do for your health.  Most adults should exercise for at least 150 minutes each week. The exercise should increase your heart rate and make you sweat (moderate-intensity exercise).  Most adults should also do strengthening exercises at least twice a week. This is in addition to the moderate-intensity exercise.  Maintain a healthy weight  Body mass index (BMI) is a measurement that can be used to identify possible weight problems. It estimates body fat based on height and weight. Your health care provider can help determine your BMI and help you achieve or maintain a healthy weight.  For females 106 years of age and older:   A BMI below 18.5 is considered underweight.  A BMI of 18.5 to 24.9 is normal.  A BMI of 25 to 29.9 is considered overweight.  A BMI of 30 and above is considered obese.  Watch levels of cholesterol and blood lipids  You should start having your blood tested for lipids and cholesterol at 65 years of age, then have this test every  5 years.  You may need to have your cholesterol levels checked more often if:  Your lipid or cholesterol levels are high.  You are older than 65 years of age.  You are at high risk for heart disease.  CANCER SCREENING   Lung Cancer  Lung cancer screening is recommended for adults 56-21 years old who are at high risk for lung cancer because of a history of smoking.  A yearly low-dose CT scan of the lungs is recommended for people who:  Currently smoke.  Have quit within the past 15 years.  Have at least a 30-pack-year history of smoking. A pack year is smoking an average of one pack of cigarettes a day for 1 year.  Yearly screening should continue until it has been 15 years since you quit.  Yearly screening should stop if you develop a health problem that would prevent you from having lung cancer treatment.  Breast Cancer  Practice breast self-awareness. This means understanding how your breasts normally appear and feel.  It also means doing regular breast self-exams. Let your health care provider know about any changes, no matter how small.  If you are in your 20s or 30s, you should have a clinical breast exam (CBE) by a health care provider every 1-3 years as part of a regular health exam.  If you are 51 or older, have a CBE every year. Also consider having a breast X-ray (mammogram) every year.  If you have  a family history of breast cancer, talk to your health care provider about genetic screening.  If you are at high risk for breast cancer, talk to your health care provider about having an MRI and a mammogram every year.  Breast cancer gene (BRCA) assessment is recommended for women who have family members with BRCA-related cancers. BRCA-related cancers include:  Breast.  Ovarian.  Tubal.  Peritoneal cancers.  Results of the assessment will determine the need for genetic counseling and BRCA1 and BRCA2 testing. Cervical Cancer Your health care provider may  recommend that you be screened regularly for cancer of the pelvic organs (ovaries, uterus, and vagina). This screening involves a pelvic examination, including checking for microscopic changes to the surface of your cervix (Pap test). You may be encouraged to have this screening done every 3 years, beginning at age 21.  For women ages 30-65, health care providers may recommend pelvic exams and Pap testing every 3 years, or they may recommend the Pap and pelvic exam, combined with testing for human papilloma virus (HPV), every 5 years. Some types of HPV increase your risk of cervical cancer. Testing for HPV may also be done on women of any age with unclear Pap test results.  Other health care providers may not recommend any screening for nonpregnant women who are considered low risk for pelvic cancer and who do not have symptoms. Ask your health care provider if a screening pelvic exam is right for you.  If you have had past treatment for cervical cancer or a condition that could lead to cancer, you need Pap tests and screening for cancer for at least 20 years after your treatment. If Pap tests have been discontinued, your risk factors (such as having a new sexual partner) need to be reassessed to determine if screening should resume. Some women have medical problems that increase the chance of getting cervical cancer. In these cases, your health care provider may recommend more frequent screening and Pap tests. Colorectal Cancer  This type of cancer can be detected and often prevented.  Routine colorectal cancer screening usually begins at 65 years of age and continues through 65 years of age.  Your health care provider may recommend screening at an earlier age if you have risk factors for colon cancer.  Your health care provider may also recommend using home test kits to check for hidden blood in the stool.  A small camera at the end of a tube can be used to examine your colon directly  (sigmoidoscopy or colonoscopy). This is done to check for the earliest forms of colorectal cancer.  Routine screening usually begins at age 50.  Direct examination of the colon should be repeated every 5-10 years through 65 years of age. However, you may need to be screened more often if early forms of precancerous polyps or small growths are found. Skin Cancer  Check your skin from head to toe regularly.  Tell your health care provider about any new moles or changes in moles, especially if there is a change in a mole's shape or color.  Also tell your health care provider if you have a mole that is larger than the size of a pencil eraser.  Always use sunscreen. Apply sunscreen liberally and repeatedly throughout the day.  Protect yourself by wearing long sleeves, pants, a wide-brimmed hat, and sunglasses whenever you are outside. HEART DISEASE, DIABETES, AND HIGH BLOOD PRESSURE   High blood pressure causes heart disease and increases the risk of stroke. High   blood pressure is more likely to develop in:  People who have blood pressure in the high end of the normal range (130-139/85-89 mm Hg).  People who are overweight or obese.  People who are African American.  If you are 18-39 years of age, have your blood pressure checked every 3-5 years. If you are 40 years of age or older, have your blood pressure checked every year. You should have your blood pressure measured twice--once when you are at a hospital or clinic, and once when you are not at a hospital or clinic. Record the average of the two measurements. To check your blood pressure when you are not at a hospital or clinic, you can use:  An automated blood pressure machine at a pharmacy.  A home blood pressure monitor.  If you are between 55 years and 79 years old, ask your health care provider if you should take aspirin to prevent strokes.  Have regular diabetes screenings. This involves taking a blood sample to check your  fasting blood sugar level.  If you are at a normal weight and have a low risk for diabetes, have this test once every three years after 65 years of age.  If you are overweight and have a high risk for diabetes, consider being tested at a younger age or more often. PREVENTING INFECTION  Hepatitis B  If you have a higher risk for hepatitis B, you should be screened for this virus. You are considered at high risk for hepatitis B if:  You were born in a country where hepatitis B is common. Ask your health care provider which countries are considered high risk.  Your parents were born in a high-risk country, and you have not been immunized against hepatitis B (hepatitis B vaccine).  You have HIV or AIDS.  You use needles to inject street drugs.  You live with someone who has hepatitis B.  You have had sex with someone who has hepatitis B.  You get hemodialysis treatment.  You take certain medicines for conditions, including cancer, organ transplantation, and autoimmune conditions. Hepatitis C  Blood testing is recommended for:  Everyone born from 1945 through 1965.  Anyone with known risk factors for hepatitis C. Sexually transmitted infections (STIs)  You should be screened for sexually transmitted infections (STIs) including gonorrhea and chlamydia if:  You are sexually active and are younger than 65 years of age.  You are older than 65 years of age and your health care provider tells you that you are at risk for this type of infection.  Your sexual activity has changed since you were last screened and you are at an increased risk for chlamydia or gonorrhea. Ask your health care provider if you are at risk.  If you do not have HIV, but are at risk, it may be recommended that you take a prescription medicine daily to prevent HIV infection. This is called pre-exposure prophylaxis (PrEP). You are considered at risk if:  You are sexually active and do not regularly use condoms or  know the HIV status of your partner(s).  You take drugs by injection.  You are sexually active with a partner who has HIV. Talk with your health care provider about whether you are at high risk of being infected with HIV. If you choose to begin PrEP, you should first be tested for HIV. You should then be tested every 3 months for as long as you are taking PrEP.  PREGNANCY   If you are   If you are premenopausal and you may become pregnant, ask your health care provider about preconception counseling.  If you may become pregnant, take 400 to 800 micrograms (mcg) of folic acid every day.  If you want to prevent pregnancy, talk to your health care provider about birth control (contraception). OSTEOPOROSIS AND MENOPAUSE   Osteoporosis is a disease in which the bones lose minerals and strength with aging. This can result in serious bone fractures. Your risk for osteoporosis can be identified using a bone density scan.  If you are 65 years of age or older, or if you are at risk for osteoporosis and fractures, ask your health care provider if you should be screened.  Ask your health care provider whether you should take a calcium or vitamin D supplement to lower your risk for osteoporosis.  Menopause may have certain physical symptoms and risks.  Hormone replacement therapy may reduce some of these symptoms and risks. Talk to your health care provider about whether hormone replacement therapy is right for you.  HOME CARE INSTRUCTIONS   Schedule regular health, dental, and eye exams.  Stay current with your immunizations.   Do not use any tobacco products including cigarettes, chewing tobacco, or electronic cigarettes.  If you are pregnant, do not drink alcohol.  If you are breastfeeding, limit how much and how often you drink alcohol.  Limit alcohol intake to no more than 1 drink per day for nonpregnant women. One drink equals 12 ounces of beer, 5 ounces of wine, or 1 ounces of hard  liquor.  Do not use street drugs.  Do not share needles.  Ask your health care provider for help if you need support or information about quitting drugs.  Tell your health care provider if you often feel depressed.  Tell your health care provider if you have ever been abused or do not feel safe at home.   This information is not intended to replace advice given to you by your health care provider. Make sure you discuss any questions you have with your health care provider.   Document Released: 09/28/2010 Document Revised: 04/05/2014 Document Reviewed: 02/14/2013 Elsevier Interactive Patient Education 2016 Elsevier Inc.  

## 2015-03-04 NOTE — Progress Notes (Signed)
Pre visit review using our clinic review tool, if applicable. No additional management support is needed unless otherwise documented below in the visit note. 

## 2015-03-04 NOTE — Telephone Encounter (Signed)
error 

## 2015-03-04 NOTE — Assessment & Plan Note (Signed)
General medical exam including breast exam and pelvic exam normal except as noted. PAP pending. Reviewed labs.  Encouraged healthy diet and exercise. Prevnar today.

## 2015-03-07 LAB — CYTOLOGY - PAP

## 2015-03-12 ENCOUNTER — Telehealth: Payer: Self-pay | Admitting: *Deleted

## 2015-03-12 NOTE — Telephone Encounter (Signed)
Spoke with the patient, reviewed the labs.

## 2015-03-12 NOTE — Telephone Encounter (Signed)
Patient requested a call back in reference to her test resultson 12/06. Please advise

## 2015-03-21 ENCOUNTER — Telehealth: Payer: Self-pay | Admitting: Internal Medicine

## 2015-03-21 NOTE — Telephone Encounter (Signed)
[employer] prior auth needs review by md. Dr. Gilford Rile given information to call for review

## 2015-03-21 NOTE — Telephone Encounter (Signed)
Pt called stating that her procedure was not approved and was told on 03/04/15 when she was at the office that it was. She is having the procedure on 03/25/15 @ 8 am. Please advise patient needs callback to [phone] or call place where procedure is being done.

## 2015-03-21 NOTE — Telephone Encounter (Signed)
Pt called and the hospital has not received the approval for up coming CT scan on her side on Dec. 27th. Pt is wanting to know if she has to call  approval or is it this office. PT spoke to [place] on Dec 14th about this and thought it was approved. Please call her at [phone].

## 2015-03-25 ENCOUNTER — Ambulatory Visit
Admission: RE | Admit: 2015-03-25 | Discharge: 2015-03-25 | Disposition: A | Discharge status: Home / Self Care | Payer: PRIVATE HEALTH INSURANCE | Source: Ambulatory Visit | Attending: Internal Medicine | Admitting: Internal Medicine

## 2015-03-25 ENCOUNTER — Other Ambulatory Visit: Payer: Self-pay | Admitting: Internal Medicine

## 2015-03-25 ENCOUNTER — Ambulatory Visit: Payer: PRIVATE HEALTH INSURANCE

## 2015-03-25 DIAGNOSIS — Z1231 Encounter for screening mammogram for malignant neoplasm of breast: Secondary | ICD-10-CM | POA: Diagnosis present

## 2015-03-25 DIAGNOSIS — Z1239 Encounter for other screening for malignant neoplasm of breast: Secondary | ICD-10-CM

## 2015-03-28 ENCOUNTER — Ambulatory Visit: Admission: RE | Admit: 2015-03-28 | Payer: PRIVATE HEALTH INSURANCE | Source: Ambulatory Visit

## 2015-04-01 ENCOUNTER — Other Ambulatory Visit: Payer: Self-pay | Admitting: Internal Medicine

## 2015-04-01 DIAGNOSIS — R1032 Left lower quadrant pain: Secondary | ICD-10-CM

## 2015-04-08 ENCOUNTER — Ambulatory Visit: Payer: PRIVATE HEALTH INSURANCE

## 2015-04-14 ENCOUNTER — Ambulatory Visit
Admission: RE | Admit: 2015-04-14 | Discharge: 2015-04-14 | Disposition: A | Discharge status: Home / Self Care | Payer: Managed Care, Other (non HMO) | Source: Ambulatory Visit | Attending: Internal Medicine | Admitting: Internal Medicine

## 2015-04-14 ENCOUNTER — Telehealth: Payer: Self-pay

## 2015-04-14 DIAGNOSIS — D259 Leiomyoma of uterus, unspecified: Secondary | ICD-10-CM | POA: Diagnosis not present

## 2015-04-14 DIAGNOSIS — R1032 Left lower quadrant pain: Secondary | ICD-10-CM | POA: Insufficient documentation

## 2015-04-14 NOTE — Telephone Encounter (Signed)
Pt calling back to speak with Almyra Free, she stated that there was message left on her machine to call the office back./tvw

## 2015-04-14 NOTE — Telephone Encounter (Signed)
Gave pt US results.

## 2015-04-28 ENCOUNTER — Telehealth: Payer: Self-pay | Admitting: Internal Medicine

## 2015-04-28 NOTE — Telephone Encounter (Signed)
I see the size of the Fibroids but it does not give an exact location. Please advise

## 2015-04-28 NOTE — Telephone Encounter (Signed)
Pt called wanting to know the size of her fibroids and location of where they are? Please leave on [facility] call @ [phone]. Thank you!

## 2015-04-29 NOTE — Telephone Encounter (Signed)
LMOM

## 2015-06-05 ENCOUNTER — Encounter: Payer: Self-pay | Admitting: Internal Medicine

## 2015-07-01 ENCOUNTER — Other Ambulatory Visit: Payer: Self-pay | Admitting: Emergency Medicine

## 2015-07-01 NOTE — Telephone Encounter (Signed)
Received a faxed medication request from [facility] in [place].  Please advise.  Thank you.

## 2015-07-02 MED ORDER — FLUTICASONE PROPIONATE 50 MCG/ACT NA SUSP: 1.0000 | Freq: Every day | NASAL | Status: AC

## 2015-07-03 ENCOUNTER — Other Ambulatory Visit: Payer: Self-pay

## 2015-07-03 MED ORDER — LISINOPRIL 20 MG PO TABS: ORAL_TABLET | ORAL | Status: DC

## 2016-05-03 ENCOUNTER — Other Ambulatory Visit: Payer: Self-pay | Admitting: Physician Assistant

## 2016-05-03 DIAGNOSIS — Z1231 Encounter for screening mammogram for malignant neoplasm of breast: Secondary | ICD-10-CM

## 2016-05-12 ENCOUNTER — Ambulatory Visit
Admission: RE | Admit: 2016-05-12 | Discharge: 2016-05-12 | Disposition: A | Discharge status: Home / Self Care | Payer: Medicare Other | Source: Ambulatory Visit | Attending: Physician Assistant | Admitting: Physician Assistant

## 2016-05-12 DIAGNOSIS — Z1231 Encounter for screening mammogram for malignant neoplasm of breast: Secondary | ICD-10-CM

## 2016-06-09 ENCOUNTER — Other Ambulatory Visit: Payer: Self-pay | Admitting: Physician Assistant

## 2016-06-28 IMAGING — MG MM DIGITAL SCREENING BILAT W/ TOMO W/ CAD
11 of 12 series · 11 of 12 positions shown · non-contrast
Comparison: Previous Exam(s)

CLINICAL DATA: Screening.

EXAM:
DIGITAL SCREENING BILATERAL MAMMOGRAM WITH 3D TOMO WITH CAD

[R MLO (1 of 4)]
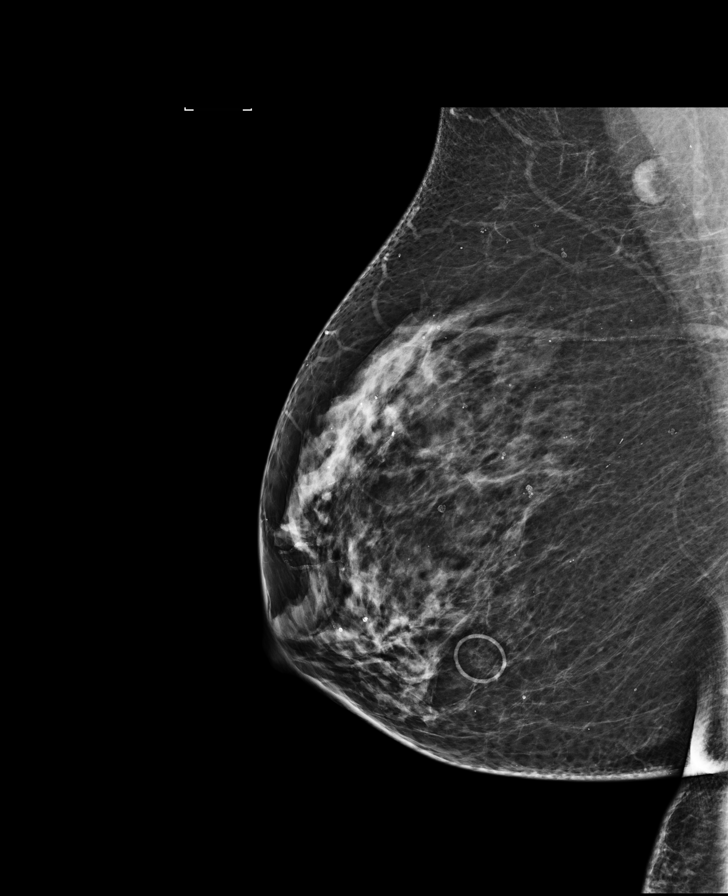

[L MLO]
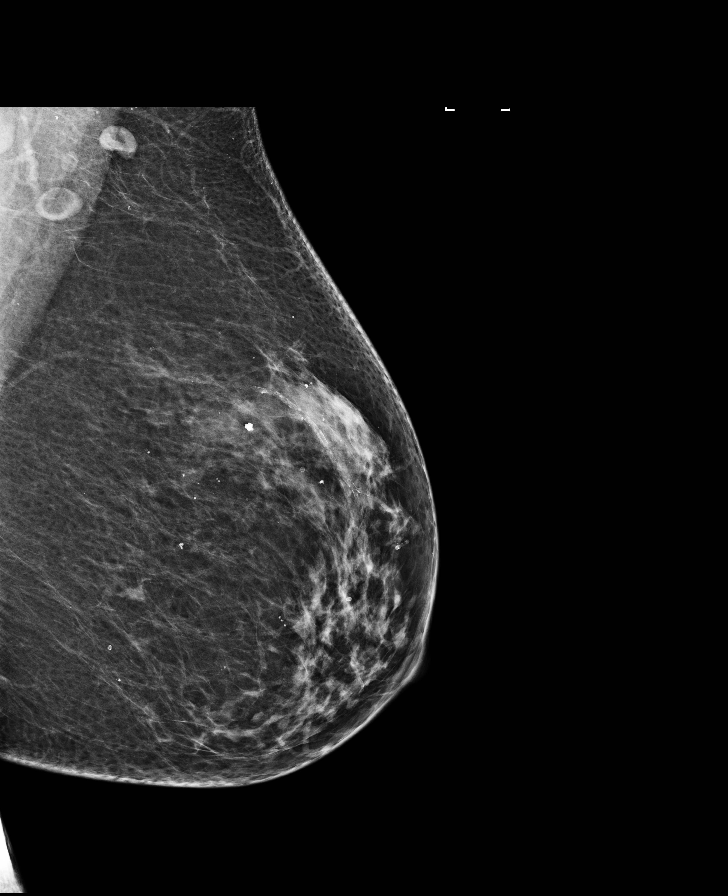

[R CC]
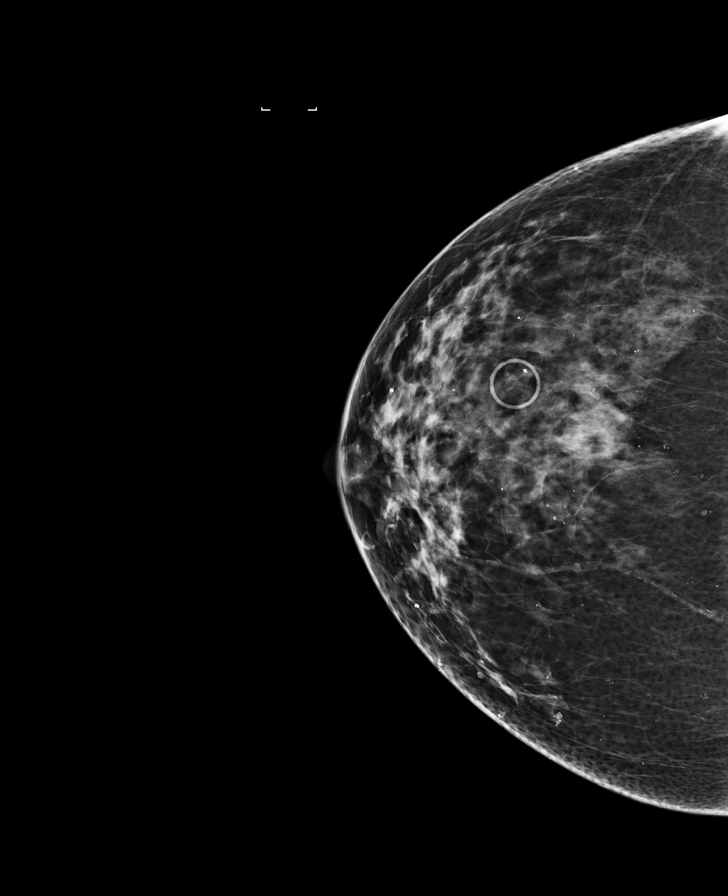

[R MLO (2 of 4)]
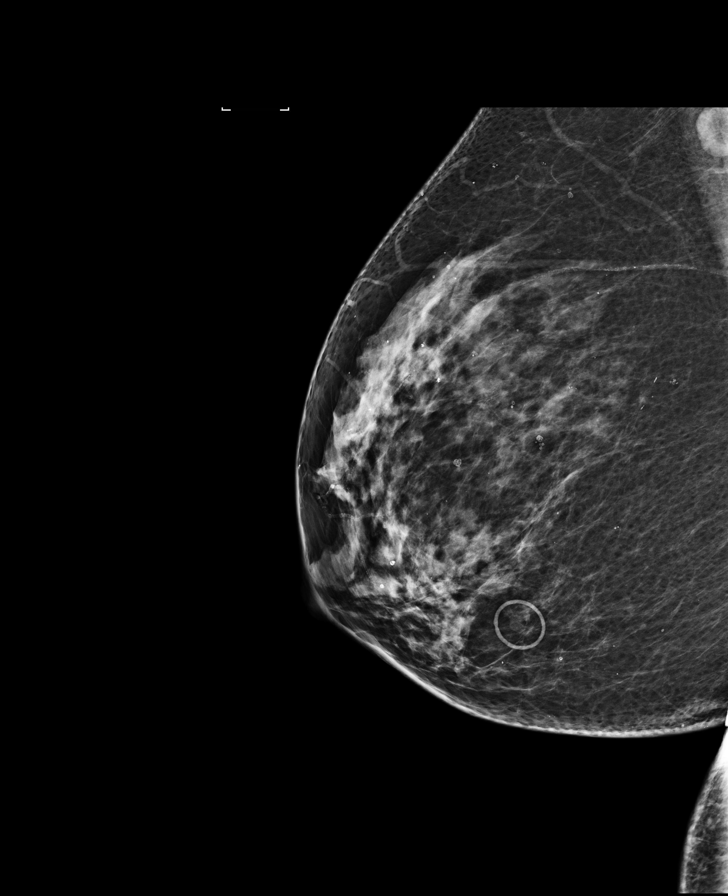

[L CC]
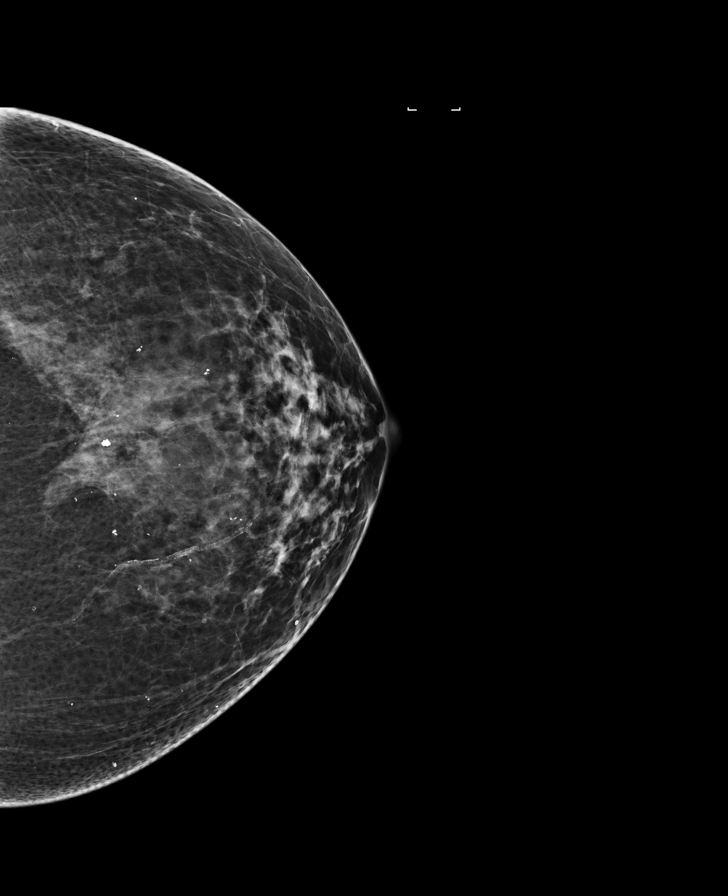

[R MLO tomo]
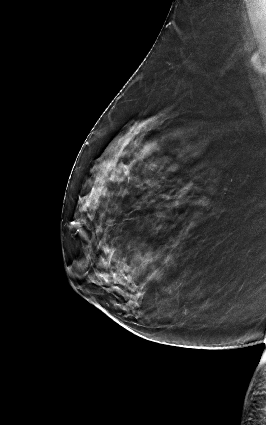

[R CC tomo]
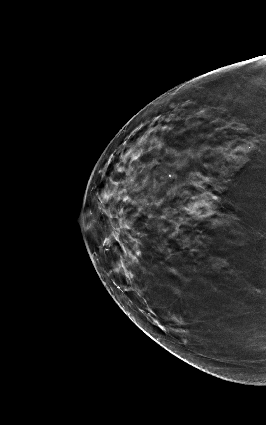

[L MLO tomo]
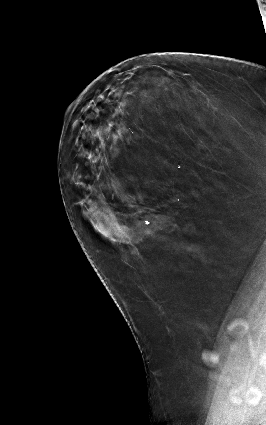

[L CC tomo]
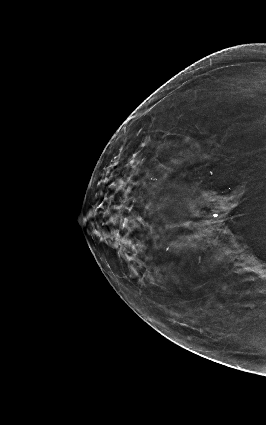

[R MLO (3 of 4)]
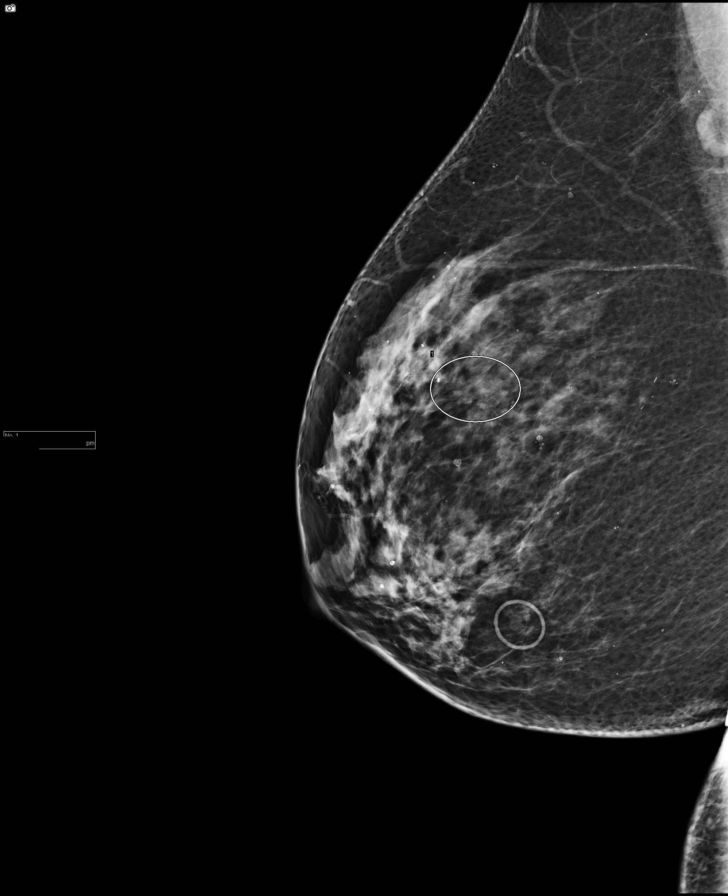

[R MLO (4 of 4)]
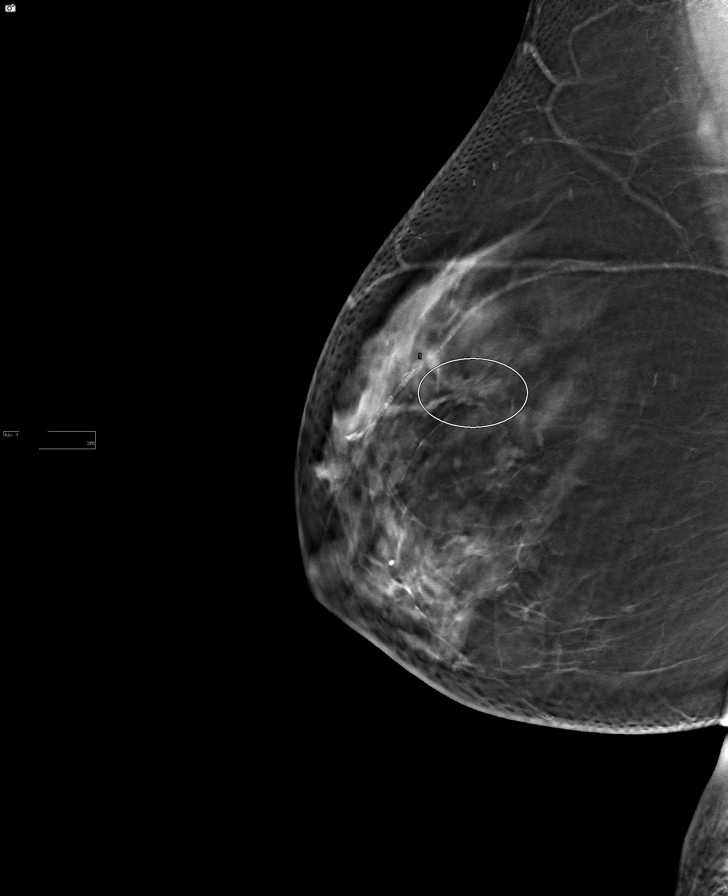

[11 of 12 positions shown; findings below may reference images not displayed]

ACR Breast Density Category c: The breast tissue is heterogeneously
dense, which may obscure small masses.
FINDINGS: In the right breast, possible distortion warrants further evaluation
with spot compression views and possibly ultrasound. In the left
breast, no findings suspicious for malignancy. Images were processed
with CAD.
IMPRESSION: Further evaluation is suggested for possible distortion in the right
breast.

RECOMMENDATION:
Diagnostic mammogram and possibly ultrasound of the right breast.
(Code:[id number])

The patient will be contacted regarding the findings, and additional
imaging will be scheduled.

BI-RADS CATEGORY  0: Incomplete. Need additional imaging evaluation
and/or prior mammograms for comparison.

## 2017-06-13 ENCOUNTER — Other Ambulatory Visit: Payer: Self-pay | Admitting: Physician Assistant

## 2017-06-13 DIAGNOSIS — Z1231 Encounter for screening mammogram for malignant neoplasm of breast: Secondary | ICD-10-CM

## 2017-07-13 ENCOUNTER — Ambulatory Visit
Admission: RE | Admit: 2017-07-13 | Discharge: 2017-07-13 | Disposition: A | Discharge status: Home / Self Care | Payer: Medicare Other | Source: Ambulatory Visit | Attending: Physician Assistant | Admitting: Physician Assistant

## 2017-07-13 DIAGNOSIS — Z1231 Encounter for screening mammogram for malignant neoplasm of breast: Secondary | ICD-10-CM | POA: Insufficient documentation

## 2018-09-07 ENCOUNTER — Other Ambulatory Visit: Payer: Self-pay | Admitting: Physician Assistant

## 2018-09-07 DIAGNOSIS — Z1231 Encounter for screening mammogram for malignant neoplasm of breast: Secondary | ICD-10-CM

## 2018-11-03 ENCOUNTER — Other Ambulatory Visit: Payer: Self-pay

## 2018-11-03 ENCOUNTER — Ambulatory Visit
Admission: RE | Admit: 2018-11-03 | Discharge: 2018-11-03 | Disposition: A | Discharge status: Home / Self Care | Payer: Medicare Other | Source: Ambulatory Visit | Attending: Physician Assistant | Admitting: Physician Assistant

## 2018-11-03 DIAGNOSIS — Z1231 Encounter for screening mammogram for malignant neoplasm of breast: Secondary | ICD-10-CM | POA: Diagnosis not present

## 2019-11-26 ENCOUNTER — Other Ambulatory Visit: Payer: Self-pay | Admitting: Physician Assistant

## 2019-11-26 DIAGNOSIS — Z1231 Encounter for screening mammogram for malignant neoplasm of breast: Secondary | ICD-10-CM

## 2019-12-21 ENCOUNTER — Other Ambulatory Visit: Payer: Self-pay

## 2019-12-21 ENCOUNTER — Ambulatory Visit
Admission: RE | Admit: 2019-12-21 | Discharge: 2019-12-21 | Disposition: A | Discharge status: Home / Self Care | Payer: Medicare PPO | Source: Ambulatory Visit | Attending: Physician Assistant | Admitting: Physician Assistant

## 2019-12-21 DIAGNOSIS — Z1231 Encounter for screening mammogram for malignant neoplasm of breast: Secondary | ICD-10-CM

## 2021-01-14 ENCOUNTER — Other Ambulatory Visit: Payer: Self-pay | Admitting: Physician Assistant

## 2021-01-14 DIAGNOSIS — Z1231 Encounter for screening mammogram for malignant neoplasm of breast: Secondary | ICD-10-CM

## 2021-02-05 ENCOUNTER — Ambulatory Visit
Admission: RE | Admit: 2021-02-05 | Discharge: 2021-02-05 | Disposition: A | Discharge status: Home / Self Care | Payer: Medicare PPO | Source: Ambulatory Visit | Attending: Physician Assistant | Admitting: Physician Assistant

## 2021-02-05 ENCOUNTER — Other Ambulatory Visit: Payer: Self-pay

## 2021-02-05 DIAGNOSIS — Z1231 Encounter for screening mammogram for malignant neoplasm of breast: Secondary | ICD-10-CM | POA: Diagnosis present

## 2022-03-16 ENCOUNTER — Other Ambulatory Visit: Payer: Self-pay | Admitting: Physician Assistant

## 2022-03-16 DIAGNOSIS — Z1231 Encounter for screening mammogram for malignant neoplasm of breast: Secondary | ICD-10-CM

## 2022-04-29 ENCOUNTER — Ambulatory Visit
Admission: RE | Admit: 2022-04-29 | Discharge: 2022-04-29 | Disposition: A | Discharge status: Home / Self Care | Payer: Medicare PPO | Source: Ambulatory Visit | Attending: Physician Assistant | Admitting: Physician Assistant

## 2022-04-29 DIAGNOSIS — Z1231 Encounter for screening mammogram for malignant neoplasm of breast: Secondary | ICD-10-CM | POA: Insufficient documentation

## 2023-03-18 ENCOUNTER — Other Ambulatory Visit: Payer: Self-pay | Admitting: Physician Assistant

## 2023-03-18 DIAGNOSIS — Z1231 Encounter for screening mammogram for malignant neoplasm of breast: Secondary | ICD-10-CM

## 2023-05-02 ENCOUNTER — Ambulatory Visit
Admission: RE | Admit: 2023-05-02 | Discharge: 2023-05-02 | Disposition: A | Discharge status: Home / Self Care | Payer: Medicare PPO | Source: Ambulatory Visit | Attending: Physician Assistant | Admitting: Physician Assistant

## 2023-05-02 DIAGNOSIS — Z1231 Encounter for screening mammogram for malignant neoplasm of breast: Secondary | ICD-10-CM | POA: Diagnosis present

## 2024-01-10 ENCOUNTER — Other Ambulatory Visit (INDEPENDENT_AMBULATORY_CARE_PROVIDER_SITE_OTHER): Payer: Self-pay | Admitting: Vascular Surgery

## 2024-01-10 DIAGNOSIS — I83813 Varicose veins of bilateral lower extremities with pain: Secondary | ICD-10-CM

## 2024-01-12 ENCOUNTER — Ambulatory Visit (INDEPENDENT_AMBULATORY_CARE_PROVIDER_SITE_OTHER): Payer: Self-pay | Admitting: Vascular Surgery

## 2024-01-12 ENCOUNTER — Encounter (INDEPENDENT_AMBULATORY_CARE_PROVIDER_SITE_OTHER): Payer: Self-pay | Admitting: Vascular Surgery

## 2024-01-12 ENCOUNTER — Other Ambulatory Visit (INDEPENDENT_AMBULATORY_CARE_PROVIDER_SITE_OTHER)

## 2024-01-12 VITALS — BP 115/75 | HR 77 | Resp 18 | Ht 62.0 in | Wt 181.4 lb

## 2024-01-12 DIAGNOSIS — I8393 Asymptomatic varicose veins of bilateral lower extremities: Secondary | ICD-10-CM

## 2024-01-12 DIAGNOSIS — I10 Essential (primary) hypertension: Secondary | ICD-10-CM

## 2024-01-12 DIAGNOSIS — I83813 Varicose veins of bilateral lower extremities with pain: Secondary | ICD-10-CM | POA: Diagnosis not present

## 2024-01-12 DIAGNOSIS — E669 Obesity, unspecified: Secondary | ICD-10-CM | POA: Diagnosis not present

## 2024-01-13 ENCOUNTER — Encounter (INDEPENDENT_AMBULATORY_CARE_PROVIDER_SITE_OTHER): Payer: Self-pay | Admitting: Vascular Surgery

## 2024-01-13 NOTE — Progress Notes (Unsigned)
 Subjective:    Patient ID: Kristie Bell, female    DOB: 27-Mar-1951, 73 y.o.   MRN: [id number] Chief Complaint  Patient presents with  . Establish Care    New patient BLE reflux + bilateral VV w/ Pain ref. Mclaughlin    HPI  Review of Systems     Objective:   Physical Exam  BP 115/75 (BP Location: Left Arm, Patient Position: Sitting, Cuff Size: Normal)   Pulse 77   Resp 18   Ht 5' 2 (1.575 m)   Wt 181 lb 6.4 oz (82.3 kg)   BMI 33.18 kg/m   Past Medical History:  Diagnosis Date  . Achilles tendinitis    left, Dr. Lilli  . Hypertension     Social History   Socioeconomic History  . Marital status: Single    Spouse name: Not on file  . Number of children: Not on file  . Years of education: Not on file  . Highest education level: Not on file  Occupational History  . Not on file  Tobacco Use  . Smoking status: Never  . Smokeless tobacco: Never  Substance and Sexual Activity  . Alcohol use: Yes    Comment: Occasionally  . Drug use: No  . Sexual activity: Not on file  Other Topics Concern  . Not on file  Social History Narrative   Lives in [place] alone. No children. No pets.      Diet - regular diet, started protein drinks      Exercise - 3-4 times per week      Work - [employer]   Social Drivers of [employer]: Low Risk  (09/15/2023)   Received from [facility]   Overall Financial Resource Strain (CARDIA)   . Difficulty of Paying Living Expenses: Not hard at all  Food Insecurity: No Food Insecurity (09/15/2023)   Received from [facility]   Hunger Vital Sign   . Within the past 12 months, you worried that your food would run out before you got the money to buy more.: Never true   . Within the past 12 months, the food you bought just didn't last and you didn't have money to get more.: Never true  Transportation Needs: No Transportation Needs (09/15/2023)    Received from [facility]   [facility] - Transportation   . In the past 12 months, has lack of transportation kept you from medical appointments or from getting medications?: No   . Lack of Transportation (Non-Medical): No  Physical Activity: Not on file  Stress: Not on file  Social Connections: Not on file  Intimate Partner Violence: Not on file    Past Surgical History:  Procedure Laterality Date  . ANAL FISTULECTOMY  2002    Family History  Problem Relation Age of Onset  . Pneumonia Father   . Diabetes Paternal Grandmother   . Breast cancer Sister   . Breast cancer Paternal Aunt 46    Allergies  Allergen Reactions  . Sulfa Antibiotics Swelling       Latest Ref Rng & Units 02/17/2015    9:40 AM 09/11/2014   12:00 AM 01/09/2014   12:00 AM  CBC  WBC 3.4 - 10.8 x10E3/uL 10.0  9.5     12.0      Hemoglobin 11.1 - 15.9 g/dL 87.9 - 83.9 g/dL 86.7    86.7     13.6  14.0      Hematocrit 34.0 - 46.6 % 36 - 46 % 38.9    39     41     42      Platelets 150 - 379 x10E3/uL 150 - 399 K/L 225    225     219     247         This result is from an external source.      CMP     Component Value Date/Time   NA 141 02/17/2015 0940   NA 141 02/17/2015 0940   K 4.4 02/17/2015 0940   K 4.4 02/17/2015 0940   CL 102 02/17/2015 0940   GLUCOSE 101 (H) 02/17/2015 0940   BUN 12 02/17/2015 0940   BUN 12 02/17/2015 0940   CREATININE 0.71 02/17/2015 0940   CREATININE 0.7 02/17/2015 0940   CALCIUM 8.9 02/17/2015 0940   PROT 6.5 02/17/2015 0940   ALBUMIN 4.0 02/17/2015 0940   AST 30 02/17/2015 0940   ALT 26 02/17/2015 0940   ALKPHOS 100 02/17/2015 0940   BILITOT 0.3 02/17/2015 0940   GFRNONAA 90 02/17/2015 0940     No results found.     Assessment & Plan:   1. Asymptomatic varicose veins of both lower extremities (Primary) ***  2. Essential hypertension, benign ***   Current Outpatient Medications on File Prior to Visit  Medication Sig Dispense  Refill  . calcium-vitamin D  250-100 MG-UNIT per tablet Take 1 tablet by mouth 2 (two) times daily.    . fluticasone  (FLONASE ) 50 MCG/ACT nasal spray Place 1 spray into both nostrils daily. 16 g 12  . lisinopril  (ZESTRIL ) 5 MG tablet Take 5 mg by mouth daily.    . Multiple Vitamins-Minerals (CENTRUM SILVER PO) Take by mouth. (Patient taking differently: Take 5 mg by mouth daily.)     No current facility-administered medications on file prior to visit.    There are no Patient Instructions on file for this visit. No follow-ups on file.   Kristie JONELLE Shank, NP

## 2024-03-19 ENCOUNTER — Other Ambulatory Visit: Payer: Self-pay | Admitting: Physician Assistant

## 2024-03-19 DIAGNOSIS — Z1231 Encounter for screening mammogram for malignant neoplasm of breast: Secondary | ICD-10-CM

## 2024-04-11 ENCOUNTER — Ambulatory Visit: Admitting: Anesthesiology

## 2024-04-11 ENCOUNTER — Encounter
Admission: RE | Disposition: A | Discharge status: Home / Self Care | Payer: Self-pay | Source: Home / Self Care | Attending: Gastroenterology

## 2024-04-11 ENCOUNTER — Encounter: Payer: Self-pay | Admitting: Gastroenterology

## 2024-04-11 ENCOUNTER — Other Ambulatory Visit: Payer: Self-pay

## 2024-04-11 ENCOUNTER — Ambulatory Visit
Admission: RE | Admit: 2024-04-11 | Discharge: 2024-04-11 | Disposition: A | Discharge status: Home / Self Care | Attending: Gastroenterology | Admitting: Gastroenterology

## 2024-04-11 DIAGNOSIS — Z1211 Encounter for screening for malignant neoplasm of colon: Secondary | ICD-10-CM | POA: Insufficient documentation

## 2024-04-11 DIAGNOSIS — Z6831 Body mass index (BMI) 31.0-31.9, adult: Secondary | ICD-10-CM | POA: Insufficient documentation

## 2024-04-11 DIAGNOSIS — K5730 Diverticulosis of large intestine without perforation or abscess without bleeding: Secondary | ICD-10-CM | POA: Diagnosis not present

## 2024-04-11 DIAGNOSIS — D122 Benign neoplasm of ascending colon: Secondary | ICD-10-CM | POA: Insufficient documentation

## 2024-04-11 DIAGNOSIS — D124 Benign neoplasm of descending colon: Secondary | ICD-10-CM | POA: Insufficient documentation

## 2024-04-11 DIAGNOSIS — E66813 Obesity, class 3: Secondary | ICD-10-CM | POA: Diagnosis not present

## 2024-04-11 DIAGNOSIS — I10 Essential (primary) hypertension: Secondary | ICD-10-CM | POA: Insufficient documentation

## 2024-04-11 HISTORY — PX: POLYPECTOMY: SHX149

## 2024-04-11 HISTORY — PX: COLONOSCOPY: SHX5424

## 2024-04-11 MED ORDER — PROPOFOL 500 MG/50ML IV EMUL
INTRAVENOUS | Status: DC | PRN
  Administered 2024-04-11: 150 ug/kg/min via INTRAVENOUS

## 2024-04-11 MED ORDER — PROPOFOL 10 MG/ML IV BOLUS
INTRAVENOUS | Status: DC | PRN
  Administered 2024-04-11: 70 mg via INTRAVENOUS

## 2024-04-11 MED ORDER — SODIUM CHLORIDE 0.9 % IV SOLN: INTRAVENOUS | Status: DC

## 2024-04-11 MED ORDER — LIDOCAINE HCL (CARDIAC) PF 100 MG/5ML IV SOSY
PREFILLED_SYRINGE | INTRAVENOUS | Status: DC | PRN
  Administered 2024-04-11: 40 mg via INTRAVENOUS

## 2024-04-11 MED ORDER — PROPOFOL 1000 MG/100ML IV EMUL
INTRAVENOUS | Status: AC
  Filled 2024-04-11: qty 100

## 2024-04-11 MED ORDER — PHENYLEPHRINE 80 MCG/ML (10ML) SYRINGE FOR IV PUSH (FOR BLOOD PRESSURE SUPPORT)
PREFILLED_SYRINGE | INTRAVENOUS | Status: DC | PRN
  Administered 2024-04-11 (×3): 160 ug via INTRAVENOUS

## 2024-04-11 NOTE — Op Note (Signed)
 [facility] Patient Name: Kristie Bell Procedure Date: 04/11/2024 7:10 AM MRN: [id number] Account #: [id number] Date of Birth: 10/24/1950 Admit Type: Outpatient Age: 74 Room: [facility] ENDO ROOM 3 Gender: Female Note Status: Finalized Instrument Name: Colon Scope [id number] Procedure:             Colonoscopy Indications:           Screening for colorectal malignant neoplasm Providers:             Ruel Kung MD, MD Referring MD:          Eva DOROTHA Crimes, MD (Referring MD) Medicines:             Monitored Anesthesia Care Complications:         No immediate complications. Procedure:             Pre-Anesthesia Assessment:                        - Prior to the procedure, a History and Physical was                         performed, and patient medications, allergies and                         sensitivities were reviewed. The patient's tolerance                         of previous anesthesia was reviewed.                        - The risks and benefits of the procedure and the                         sedation options and risks were discussed with the                         patient. All questions were answered and informed                         consent was obtained.                        - ASA Grade Assessment: II - A patient with mild                         systemic disease.                        After obtaining informed consent, the colonoscope was                         passed under direct vision. Throughout the procedure,                         the patient's blood pressure, pulse, and oxygen                         saturations were monitored continuously. The                         Colonoscope  was introduced through the anus and                         advanced to the the cecum, identified by the                         appendiceal orifice. The colonoscopy was performed                         with ease. The patient tolerated the procedure  well.                         The quality of the bowel preparation was adequate. The                         ileocecal valve, appendiceal orifice, and rectum were                         photographed. Findings:      The perianal and digital rectal examinations were normal.      Multiple diverticula were found in the sigmoid colon.      Two sessile polyps were found in the descending colon. The polyps were 4       to 5 mm in size. These polyps were removed with a cold snare. Resection       and retrieval were complete.      A 3 mm polyp was found in the ascending colon. The polyp was sessile.       The polyp was removed with a jumbo cold forceps. Resection and retrieval       were complete.      The exam was otherwise without abnormality on direct and retroflexion       views. Impression:            - Diverticulosis in the sigmoid colon.                        - Two 4 to 5 mm polyps in the descending colon,                         removed with a cold snare. Resected and retrieved.                        - One 3 mm polyp in the ascending colon, removed with                         a jumbo cold forceps. Resected and retrieved.                        - The examination was otherwise normal on direct and                         retroflexion views. Recommendation:        - Discharge patient to home (with escort).                        - Resume regular diet.                        -  Continue present medications.                        - Await pathology results.                        - Repeat colonoscopy is not recommended due to current                         age (26 years or older) for surveillance. Procedure Code(s):     --- Professional ---                        [phone], Colonoscopy, flexible; with removal of                         tumor(s), polyp(s), or other lesion(s) by snare                         technique                        45380, 59, Colonoscopy, flexible; with biopsy,  single                         or multiple Diagnosis Code(s):     --- Professional ---                        Z12.11, Encounter for screening for malignant neoplasm                         of colon                        D12.4, Benign neoplasm of descending colon                        D12.2, Benign neoplasm of ascending colon                        K57.30, Diverticulosis of large intestine without                         perforation or abscess without bleeding CPT copyright 2022 American Medical Association. All rights reserved. The codes documented in this report are preliminary and upon coder review may  be revised to meet current compliance requirements. Ruel Kung, MD Ruel Kung MD, MD 04/11/2024 8:05:30 AM This report has been signed electronically. Number of Addenda: 0 Note Initiated On: 04/11/2024 7:10 AM Scope Withdrawal Time: 0 hours 13 minutes 8 seconds  Total Procedure Duration: 0 hours 16 minutes 16 seconds  Estimated Blood Loss:  Estimated blood loss: none.      [facility]

## 2024-04-11 NOTE — Anesthesia Postprocedure Evaluation (Signed)
"   Anesthesia Post Note  Patient: Kristie Bell  Procedure(s) Performed: COLONOSCOPY POLYPECTOMY, INTESTINE  Patient location during evaluation: PACU Anesthesia Type: General Level of consciousness: awake and awake and alert Pain management: satisfactory to patient Vital Signs Assessment: post-procedure vital signs reviewed and stable Respiratory status: spontaneous breathing Cardiovascular status: blood pressure returned to baseline Anesthetic complications: no   No notable events documented.   Last Vitals:  Vitals:   04/11/24 0825 04/11/24 0826  BP: 105/64 106/74  Pulse: 75 73  Resp: 17 (!) 22  Temp:    SpO2: 94% 95%    Last Pain:  Vitals:   04/11/24 0806  TempSrc: Temporal  PainSc:                  VAN STAVEREN,Raniah Karan      "

## 2024-04-11 NOTE — Anesthesia Preprocedure Evaluation (Signed)
"                                    Anesthesia Evaluation  Patient identified by MRN, date of birth, ID band Patient awake    Reviewed: Allergy & Precautions, NPO status , Patient's Chart, lab work & pertinent test results  Airway Mallampati: II  TM Distance: <3 FB Neck ROM: full    Dental  (+) Teeth Intact   Pulmonary neg pulmonary ROS   Pulmonary exam normal        Cardiovascular Exercise Tolerance: Good hypertension, Pt. on medications negative cardio ROS Normal cardiovascular exam Rhythm:Regular Rate:Normal     Neuro/Psych negative neurological ROS  negative psych ROS   GI/Hepatic negative GI ROS, Neg liver ROS,,,  Endo/Other  negative endocrine ROS  Class 3 obesity  Renal/GU negative Renal ROS  negative genitourinary   Musculoskeletal   Abdominal  (+) + obese  Peds negative pediatric ROS (+)  Hematology negative hematology ROS (+)   Anesthesia Other Findings Past Medical History: No date: Achilles tendinitis     Comment:  left, Dr. Lilli No date: Hypertension  Past Surgical History: 2002: ANAL FISTULECTOMY     Reproductive/Obstetrics negative OB ROS                              Anesthesia Physical Anesthesia Plan  ASA: 2  Anesthesia Plan: General   Post-op Pain Management:    Induction: Intravenous  PONV Risk Score and Plan: Propofol  infusion and TIVA  Airway Management Planned: Natural Airway and Nasal Cannula  Additional Equipment:   Intra-op Plan:   Post-operative Plan:   Informed Consent: I have reviewed the patients History and Physical, chart, labs and discussed the procedure including the risks, benefits and alternatives for the proposed anesthesia with the patient or authorized representative who has indicated his/her understanding and acceptance.     Dental Advisory Given  Plan Discussed with: CRNA  Anesthesia Plan Comments:         Anesthesia Quick Evaluation  "

## 2024-04-11 NOTE — Anesthesia Procedure Notes (Signed)
 Date/Time: 04/11/2024 7:44 AM  Performed by: Tod Handing, CRNAPre-anesthesia Checklist: Patient identified, Emergency Drugs available, Suction available and Patient being monitored Patient Re-evaluated:Patient Re-evaluated prior to induction Oxygen Delivery Method: Nasal cannula Induction Type: IV induction Dental Injury: Teeth and Oropharynx as per pre-operative assessment  Comments: Nasal cannula with etCO2 monitoring

## 2024-04-11 NOTE — H&P (Signed)
 "                                                                                                                           Ruel Kung , MD [address], [address], [place], [place], [address] Phone: [phone] Fax: [phone]  Primary Care Physician:  Marikay Eva POUR, PA   Pre-Procedure History & Physical: HPI:  Kristie Bell is a 74 y.o. female is here for an colonoscopy.   Past Medical History:  Diagnosis Date   Achilles tendinitis    left, Dr. Lilli   Hypertension     Past Surgical History:  Procedure Laterality Date   ANAL FISTULECTOMY  2002    Prior to Admission medications  Medication Sig Start Date End Date Taking? Authorizing Provider  lisinopril  (ZESTRIL ) 5 MG tablet Take 5 mg by mouth daily.   Yes [provider]  calcium-vitamin D  250-100 MG-UNIT per tablet Take 1 tablet by mouth 2 (two) times daily.    [provider]  fluticasone  (FLONASE ) 50 MCG/ACT nasal spray Place 1 spray into both nostrils daily. 07/02/15   Fisher, Devere ORN, PA-C  Multiple Vitamins-Minerals (CENTRUM SILVER PO) Take by mouth. Patient taking differently: Take 5 mg by mouth daily.    [provider]    Allergies as of 04/02/2024 - Review Complete 01/13/2024  Allergen Reaction Noted   Sulfa antibiotics Swelling 09/25/2012    Family History  Problem Relation Age of Onset   Pneumonia Father    Diabetes Paternal Grandmother    Breast cancer Sister    Breast cancer Paternal Aunt 27    Social History   Socioeconomic History   Marital status: Single    Spouse name: Not on file   Number of children: Not on file   Years of education: Not on file   Highest education level: Not on file  Occupational History   Not on file  Tobacco Use   Smoking status: Never   Smokeless tobacco: Never  Substance and Sexual Activity   Alcohol use: Yes    Comment: Occasionally   Drug use: No   Sexual activity: Not on file  Other Topics Concern   Not on file   Social History Narrative   Lives in [place] alone. No children. No pets.      Diet - regular diet, started protein drinks      Exercise - 3-4 times per week      Work - [employer]   Social Drivers of Health   Tobacco Use: Low Risk (04/11/2024)   Patient History    Smoking Tobacco Use: Never    Smokeless Tobacco Use: Never    Passive Exposure: Not on file  Financial Resource Strain: Low Risk  (03/19/2024)   Received from [facility]   Overall Financial Resource Strain (CARDIA)    Difficulty of Paying Living Expenses: Not hard at all  Food Insecurity: No Food Insecurity (  03/19/2024)   Received from [facility]   Epic    Within the past 12 months, you worried that your food would run out before you got the money to buy more.: Never true    Within the past 12 months, the food you bought just didn't last and you didn't have money to get more.: Never true  Transportation Needs: No Transportation Needs (03/19/2024)   Received from [facility] - Transportation    In the past 12 months, has lack of transportation kept you from medical appointments or from getting medications?: No    Lack of Transportation (Non-Medical): No  Physical Activity: Not on file  Stress: Not on file  Social Connections: Not on file  Intimate Partner Violence: Not on file  Depression (EYV7-0): Not on file  Alcohol Screen: Not on file  Housing: Low Risk  (03/19/2024)   Received from [facility]   Epic    In the last 12 months, was there a time when you were not able to pay the mortgage or rent on time?: No    In the past 12 months, how many times have you moved where you were living?: 0    At any time in the past 12 months, were you homeless or living in a shelter (including now)?: No  Utilities: Not At Risk (03/19/2024)   Received from [facility]   Epic    In the past 12  months has the electric, gas, oil, or water company threatened to shut off services in your home?: No  Health Literacy: Not on file    Review of Systems: See HPI, otherwise negative ROS  Physical Exam: BP 122/84   Pulse 85   Temp (!) 96.4 F (35.8 C) (Temporal)   Resp 18   Ht 5' 2 (1.575 m)   Wt 78.7 kg   SpO2 97%   BMI 31.75 kg/m  General:   Alert,  pleasant and cooperative in NAD Head:  Normocephalic and atraumatic. Neck:  Supple; no masses or thyromegaly. Lungs:  Clear throughout to auscultation, normal respiratory effort.    Heart:  +S1, +S2, Regular rate and rhythm, No edema. Abdomen:  Soft, nontender and nondistended. Normal bowel sounds, without guarding, and without rebound.   Neurologic:  Alert and  oriented x4;  grossly normal neurologically.  Impression/Plan: Kristie Bell is here for an colonoscopy to be performed for Screening colonoscopy average risk   Risks, benefits, limitations, and alternatives regarding  colonoscopy have been reviewed with the patient.  Questions have been answered.  All parties agreeable.   Ruel Kung, MD  04/11/2024, 7:39 AM  "

## 2024-04-11 NOTE — Transfer of Care (Signed)
 Immediate Anesthesia Transfer of Care Note  Patient: Kristie Bell  Procedure(s) Performed: Procedures: COLONOSCOPY (N/A) POLYPECTOMY, INTESTINE  Patient Location: PACU and Endoscopy Unit  Anesthesia Type:General  Level of Consciousness: sedated  Airway & Oxygen Therapy: Patient Spontanous Breathing and Patient connected to nasal cannula oxygen  Post-op Assessment: Report given to RN and Post -op Vital signs reviewed and stable  Post vital signs: Reviewed and stable  Last Vitals:  Vitals:   04/11/24 0809 04/11/24 0810  BP: (!) 74/53 (!) 92/59  Pulse: 72 77  Resp: (!) 21 (!) 23  Temp:    SpO2: 97% 96%    Complications: No apparent anesthesia complications

## 2024-04-12 ENCOUNTER — Encounter: Payer: Self-pay | Admitting: Gastroenterology

## 2024-04-12 LAB — SURGICAL PATHOLOGY

## 2024-04-25 ENCOUNTER — Ambulatory Visit: Payer: Self-pay | Admitting: Gastroenterology

## 2024-05-02 ENCOUNTER — Ambulatory Visit
Admission: RE | Admit: 2024-05-02 | Discharge: 2024-05-02 | Disposition: A | Source: Ambulatory Visit | Attending: Physician Assistant | Admitting: Physician Assistant

## 2024-05-02 DIAGNOSIS — Z1231 Encounter for screening mammogram for malignant neoplasm of breast: Secondary | ICD-10-CM
